# Patient Record
Sex: Female | Born: 1980
Health system: Southern US, Community
[De-identification: ages and names within clinical notes are randomized; demographics above are authoritative.]

## PROBLEM LIST (undated history)

## (undated) DIAGNOSIS — Z789 Other specified health status: Secondary | ICD-10-CM

## (undated) HISTORY — DX: Other specified health status: Z78.9

## (undated) HISTORY — PX: NO PAST SURGERIES: SHX2092

---

## 2001-03-29 HISTORY — PX: WISDOM TOOTH EXTRACTION: SHX21

## 2016-03-10 DIAGNOSIS — Z0000 Encounter for general adult medical examination without abnormal findings: Secondary | ICD-10-CM | POA: Diagnosis not present

## 2016-03-10 DIAGNOSIS — Z23 Encounter for immunization: Secondary | ICD-10-CM | POA: Diagnosis not present

## 2016-03-10 DIAGNOSIS — R8290 Unspecified abnormal findings in urine: Secondary | ICD-10-CM | POA: Diagnosis not present

## 2016-03-10 DIAGNOSIS — D649 Anemia, unspecified: Secondary | ICD-10-CM | POA: Diagnosis not present

## 2016-04-05 DIAGNOSIS — R319 Hematuria, unspecified: Secondary | ICD-10-CM | POA: Diagnosis not present

## 2017-03-15 DIAGNOSIS — Z13220 Encounter for screening for lipoid disorders: Secondary | ICD-10-CM | POA: Diagnosis not present

## 2017-03-15 DIAGNOSIS — Z0000 Encounter for general adult medical examination without abnormal findings: Secondary | ICD-10-CM | POA: Diagnosis not present

## 2017-03-15 DIAGNOSIS — Z131 Encounter for screening for diabetes mellitus: Secondary | ICD-10-CM | POA: Diagnosis not present

## 2017-04-01 DIAGNOSIS — R319 Hematuria, unspecified: Secondary | ICD-10-CM | POA: Diagnosis not present

## 2018-03-31 DIAGNOSIS — J029 Acute pharyngitis, unspecified: Secondary | ICD-10-CM | POA: Diagnosis not present

## 2018-04-10 ENCOUNTER — Other Ambulatory Visit: Payer: Self-pay | Admitting: Family Medicine

## 2018-04-10 ENCOUNTER — Other Ambulatory Visit (HOSPITAL_COMMUNITY)
Admission: RE | Admit: 2018-04-10 | Discharge: 2018-04-10 | Disposition: A | Discharge status: Home / Self Care | Payer: BLUE CROSS/BLUE SHIELD | Source: Ambulatory Visit | Attending: Family Medicine | Admitting: Family Medicine

## 2018-04-10 DIAGNOSIS — Z124 Encounter for screening for malignant neoplasm of cervix: Secondary | ICD-10-CM | POA: Insufficient documentation

## 2018-04-10 DIAGNOSIS — Z0000 Encounter for general adult medical examination without abnormal findings: Secondary | ICD-10-CM | POA: Diagnosis not present

## 2018-04-10 DIAGNOSIS — R634 Abnormal weight loss: Secondary | ICD-10-CM | POA: Diagnosis not present

## 2018-04-10 DIAGNOSIS — R319 Hematuria, unspecified: Secondary | ICD-10-CM | POA: Diagnosis not present

## 2018-04-12 LAB — CYTOLOGY - PAP
Diagnosis: NEGATIVE
HPV: NOT DETECTED

## 2018-05-09 DIAGNOSIS — J069 Acute upper respiratory infection, unspecified: Secondary | ICD-10-CM | POA: Diagnosis not present

## 2018-10-26 DIAGNOSIS — Z3200 Encounter for pregnancy test, result unknown: Secondary | ICD-10-CM | POA: Diagnosis not present

## 2018-10-26 DIAGNOSIS — Z3689 Encounter for other specified antenatal screening: Secondary | ICD-10-CM | POA: Diagnosis not present

## 2018-11-07 DIAGNOSIS — Z3682 Encounter for antenatal screening for nuchal translucency: Secondary | ICD-10-CM | POA: Diagnosis not present

## 2018-11-07 DIAGNOSIS — O09511 Supervision of elderly primigravida, first trimester: Secondary | ICD-10-CM | POA: Diagnosis not present

## 2018-11-07 DIAGNOSIS — Z3689 Encounter for other specified antenatal screening: Secondary | ICD-10-CM | POA: Diagnosis not present

## 2018-11-07 DIAGNOSIS — Z3201 Encounter for pregnancy test, result positive: Secondary | ICD-10-CM | POA: Diagnosis not present

## 2018-11-07 LAB — OB RESULTS CONSOLE GC/CHLAMYDIA
Chlamydia: NEGATIVE
Gonorrhea: NEGATIVE

## 2018-11-07 LAB — OB RESULTS CONSOLE RUBELLA ANTIBODY, IGM: Rubella: IMMUNE

## 2018-11-07 LAB — OB RESULTS CONSOLE HEPATITIS B SURFACE ANTIGEN: Hepatitis B Surface Ag: NEGATIVE

## 2018-11-07 LAB — OB RESULTS CONSOLE ANTIBODY SCREEN: Antibody Screen: NEGATIVE

## 2018-11-07 LAB — OB RESULTS CONSOLE ABO/RH: RH Type: POSITIVE

## 2018-11-07 LAB — OB RESULTS CONSOLE RPR: RPR: NONREACTIVE

## 2018-11-07 LAB — OB RESULTS CONSOLE HIV ANTIBODY (ROUTINE TESTING): HIV: NONREACTIVE

## 2018-11-21 DIAGNOSIS — Z3689 Encounter for other specified antenatal screening: Secondary | ICD-10-CM | POA: Diagnosis not present

## 2018-11-21 DIAGNOSIS — O09511 Supervision of elderly primigravida, first trimester: Secondary | ICD-10-CM | POA: Diagnosis not present

## 2018-11-21 DIAGNOSIS — O09512 Supervision of elderly primigravida, second trimester: Secondary | ICD-10-CM | POA: Diagnosis not present

## 2018-11-21 DIAGNOSIS — Z3A14 14 weeks gestation of pregnancy: Secondary | ICD-10-CM | POA: Diagnosis not present

## 2018-12-26 DIAGNOSIS — O09519 Supervision of elderly primigravida, unspecified trimester: Secondary | ICD-10-CM | POA: Diagnosis not present

## 2018-12-26 DIAGNOSIS — O09522 Supervision of elderly multigravida, second trimester: Secondary | ICD-10-CM | POA: Diagnosis not present

## 2018-12-26 DIAGNOSIS — Z3A19 19 weeks gestation of pregnancy: Secondary | ICD-10-CM | POA: Diagnosis not present

## 2018-12-26 DIAGNOSIS — Z361 Encounter for antenatal screening for raised alphafetoprotein level: Secondary | ICD-10-CM | POA: Diagnosis not present

## 2019-01-22 DIAGNOSIS — Z3A23 23 weeks gestation of pregnancy: Secondary | ICD-10-CM | POA: Diagnosis not present

## 2019-01-22 DIAGNOSIS — O09522 Supervision of elderly multigravida, second trimester: Secondary | ICD-10-CM | POA: Diagnosis not present

## 2019-01-23 ENCOUNTER — Other Ambulatory Visit (HOSPITAL_COMMUNITY): Payer: Self-pay | Admitting: Obstetrics and Gynecology

## 2019-01-23 ENCOUNTER — Encounter (HOSPITAL_COMMUNITY): Payer: Self-pay

## 2019-01-23 DIAGNOSIS — O43122 Velamentous insertion of umbilical cord, second trimester: Secondary | ICD-10-CM

## 2019-01-23 DIAGNOSIS — O26842 Uterine size-date discrepancy, second trimester: Secondary | ICD-10-CM

## 2019-01-23 DIAGNOSIS — Z363 Encounter for antenatal screening for malformations: Secondary | ICD-10-CM

## 2019-01-23 DIAGNOSIS — Z3A24 24 weeks gestation of pregnancy: Secondary | ICD-10-CM

## 2019-01-24 ENCOUNTER — Other Ambulatory Visit: Payer: Self-pay

## 2019-01-24 ENCOUNTER — Encounter (HOSPITAL_COMMUNITY): Payer: Self-pay

## 2019-01-24 ENCOUNTER — Ambulatory Visit (HOSPITAL_COMMUNITY): Payer: BC Managed Care – PPO | Admitting: *Deleted

## 2019-01-24 ENCOUNTER — Other Ambulatory Visit (HOSPITAL_COMMUNITY): Payer: Self-pay | Admitting: Obstetrics and Gynecology

## 2019-01-24 ENCOUNTER — Other Ambulatory Visit (HOSPITAL_COMMUNITY): Payer: Self-pay | Admitting: *Deleted

## 2019-01-24 ENCOUNTER — Ambulatory Visit (HOSPITAL_COMMUNITY)
Admission: RE | Admit: 2019-01-24 | Discharge: 2019-01-24 | Disposition: A | Discharge status: Home / Self Care | Payer: BC Managed Care – PPO | Source: Ambulatory Visit | Attending: Obstetrics and Gynecology | Admitting: Obstetrics and Gynecology

## 2019-01-24 DIAGNOSIS — Z3A24 24 weeks gestation of pregnancy: Secondary | ICD-10-CM | POA: Diagnosis not present

## 2019-01-24 DIAGNOSIS — O09522 Supervision of elderly multigravida, second trimester: Secondary | ICD-10-CM | POA: Diagnosis not present

## 2019-01-24 DIAGNOSIS — O365920 Maternal care for other known or suspected poor fetal growth, second trimester, not applicable or unspecified: Secondary | ICD-10-CM

## 2019-01-24 DIAGNOSIS — O43122 Velamentous insertion of umbilical cord, second trimester: Secondary | ICD-10-CM | POA: Diagnosis not present

## 2019-01-24 DIAGNOSIS — Z363 Encounter for antenatal screening for malformations: Secondary | ICD-10-CM

## 2019-01-24 DIAGNOSIS — O26842 Uterine size-date discrepancy, second trimester: Secondary | ICD-10-CM

## 2019-01-24 DIAGNOSIS — Z3A23 23 weeks gestation of pregnancy: Secondary | ICD-10-CM | POA: Diagnosis not present

## 2019-01-30 ENCOUNTER — Ambulatory Visit (HOSPITAL_COMMUNITY): Payer: BLUE CROSS/BLUE SHIELD

## 2019-01-31 ENCOUNTER — Ambulatory Visit (HOSPITAL_COMMUNITY): Payer: BLUE CROSS/BLUE SHIELD

## 2019-01-31 ENCOUNTER — Other Ambulatory Visit (HOSPITAL_COMMUNITY): Payer: BLUE CROSS/BLUE SHIELD

## 2019-02-01 ENCOUNTER — Encounter (HOSPITAL_COMMUNITY): Payer: Self-pay | Admitting: Obstetrics

## 2019-02-07 ENCOUNTER — Ambulatory Visit (HOSPITAL_COMMUNITY)
Admission: RE | Admit: 2019-02-07 | Discharge: 2019-02-07 | Disposition: A | Discharge status: Home / Self Care | Payer: BLUE CROSS/BLUE SHIELD | Source: Ambulatory Visit | Attending: Obstetrics and Gynecology | Admitting: Obstetrics and Gynecology

## 2019-02-07 ENCOUNTER — Encounter (HOSPITAL_COMMUNITY): Payer: Self-pay

## 2019-02-07 ENCOUNTER — Other Ambulatory Visit (HOSPITAL_COMMUNITY): Payer: Self-pay | Admitting: *Deleted

## 2019-02-07 ENCOUNTER — Other Ambulatory Visit: Payer: Self-pay

## 2019-02-07 ENCOUNTER — Ambulatory Visit (HOSPITAL_COMMUNITY): Payer: BLUE CROSS/BLUE SHIELD | Admitting: *Deleted

## 2019-02-07 VITALS — BP 109/67 | HR 83 | Temp 98.5°F

## 2019-02-07 DIAGNOSIS — Z3A25 25 weeks gestation of pregnancy: Secondary | ICD-10-CM

## 2019-02-07 DIAGNOSIS — O09519 Supervision of elderly primigravida, unspecified trimester: Secondary | ICD-10-CM

## 2019-02-07 DIAGNOSIS — O09522 Supervision of elderly multigravida, second trimester: Secondary | ICD-10-CM

## 2019-02-07 DIAGNOSIS — O365920 Maternal care for other known or suspected poor fetal growth, second trimester, not applicable or unspecified: Secondary | ICD-10-CM | POA: Insufficient documentation

## 2019-02-07 DIAGNOSIS — O43122 Velamentous insertion of umbilical cord, second trimester: Secondary | ICD-10-CM

## 2019-02-07 DIAGNOSIS — O365990 Maternal care for other known or suspected poor fetal growth, unspecified trimester, not applicable or unspecified: Secondary | ICD-10-CM

## 2019-02-13 DIAGNOSIS — O09522 Supervision of elderly multigravida, second trimester: Secondary | ICD-10-CM | POA: Diagnosis not present

## 2019-02-13 DIAGNOSIS — Z3A26 26 weeks gestation of pregnancy: Secondary | ICD-10-CM | POA: Diagnosis not present

## 2019-02-14 ENCOUNTER — Ambulatory Visit (HOSPITAL_COMMUNITY): Payer: BLUE CROSS/BLUE SHIELD | Admitting: *Deleted

## 2019-02-14 ENCOUNTER — Encounter (HOSPITAL_COMMUNITY): Payer: Self-pay

## 2019-02-14 ENCOUNTER — Other Ambulatory Visit: Payer: Self-pay

## 2019-02-14 ENCOUNTER — Ambulatory Visit (HOSPITAL_COMMUNITY)
Admission: RE | Admit: 2019-02-14 | Discharge: 2019-02-14 | Disposition: A | Discharge status: Home / Self Care | Payer: BLUE CROSS/BLUE SHIELD | Source: Ambulatory Visit | Attending: Obstetrics and Gynecology | Admitting: Obstetrics and Gynecology

## 2019-02-14 VITALS — BP 98/60 | HR 60 | Temp 97.2°F

## 2019-02-14 DIAGNOSIS — O09519 Supervision of elderly primigravida, unspecified trimester: Secondary | ICD-10-CM

## 2019-02-14 DIAGNOSIS — O365990 Maternal care for other known or suspected poor fetal growth, unspecified trimester, not applicable or unspecified: Secondary | ICD-10-CM | POA: Diagnosis not present

## 2019-02-14 DIAGNOSIS — O43122 Velamentous insertion of umbilical cord, second trimester: Secondary | ICD-10-CM

## 2019-02-14 DIAGNOSIS — Z3A26 26 weeks gestation of pregnancy: Secondary | ICD-10-CM

## 2019-02-14 DIAGNOSIS — O09522 Supervision of elderly multigravida, second trimester: Secondary | ICD-10-CM

## 2019-02-14 DIAGNOSIS — Z362 Encounter for other antenatal screening follow-up: Secondary | ICD-10-CM | POA: Diagnosis not present

## 2019-02-14 DIAGNOSIS — O365920 Maternal care for other known or suspected poor fetal growth, second trimester, not applicable or unspecified: Secondary | ICD-10-CM | POA: Diagnosis not present

## 2019-02-15 ENCOUNTER — Other Ambulatory Visit (HOSPITAL_COMMUNITY): Payer: Self-pay | Admitting: *Deleted

## 2019-02-15 ENCOUNTER — Other Ambulatory Visit (HOSPITAL_COMMUNITY): Payer: BC Managed Care – PPO

## 2019-02-15 DIAGNOSIS — O365930 Maternal care for other known or suspected poor fetal growth, third trimester, not applicable or unspecified: Secondary | ICD-10-CM

## 2019-02-26 ENCOUNTER — Ambulatory Visit (HOSPITAL_COMMUNITY): Payer: BLUE CROSS/BLUE SHIELD | Admitting: *Deleted

## 2019-02-26 ENCOUNTER — Ambulatory Visit (HOSPITAL_COMMUNITY)
Admission: RE | Admit: 2019-02-26 | Discharge: 2019-02-26 | Disposition: A | Discharge status: Home / Self Care | Payer: BLUE CROSS/BLUE SHIELD | Source: Ambulatory Visit | Attending: Obstetrics and Gynecology | Admitting: Obstetrics and Gynecology

## 2019-02-26 ENCOUNTER — Encounter (HOSPITAL_COMMUNITY): Payer: Self-pay

## 2019-02-26 ENCOUNTER — Other Ambulatory Visit: Payer: Self-pay

## 2019-02-26 VITALS — BP 108/53 | HR 74 | Temp 97.4°F

## 2019-02-26 DIAGNOSIS — Z3A28 28 weeks gestation of pregnancy: Secondary | ICD-10-CM

## 2019-02-26 DIAGNOSIS — O365930 Maternal care for other known or suspected poor fetal growth, third trimester, not applicable or unspecified: Secondary | ICD-10-CM

## 2019-02-26 DIAGNOSIS — O43123 Velamentous insertion of umbilical cord, third trimester: Secondary | ICD-10-CM

## 2019-02-26 DIAGNOSIS — O365920 Maternal care for other known or suspected poor fetal growth, second trimester, not applicable or unspecified: Secondary | ICD-10-CM

## 2019-02-26 NOTE — Procedures (Signed)
RAPHAELA CANNADAY February 25, 1981 [redacted]w[redacted]d  Fetus A Non-Stress Test Interpretation for 02/26/19  Indication: IUGR  Fetal Heart Rate A Mode: External Baseline Rate (A): 140 bpm Variability: Moderate Accelerations: 15 x 15 Decelerations: Variable  Uterine Activity Mode: Toco Contraction Frequency (min): none noted  Interpretation (Fetal Testing) Nonstress Test Interpretation: Reactive Comments: FHR tracing rev'd by Dr. Donalee Citrin

## 2019-02-27 ENCOUNTER — Other Ambulatory Visit (HOSPITAL_COMMUNITY): Payer: Self-pay | Admitting: *Deleted

## 2019-02-27 DIAGNOSIS — O365930 Maternal care for other known or suspected poor fetal growth, third trimester, not applicable or unspecified: Secondary | ICD-10-CM

## 2019-03-01 DIAGNOSIS — O365930 Maternal care for other known or suspected poor fetal growth, third trimester, not applicable or unspecified: Secondary | ICD-10-CM | POA: Diagnosis not present

## 2019-03-01 DIAGNOSIS — Z3689 Encounter for other specified antenatal screening: Secondary | ICD-10-CM | POA: Diagnosis not present

## 2019-03-01 DIAGNOSIS — Z3A28 28 weeks gestation of pregnancy: Secondary | ICD-10-CM | POA: Diagnosis not present

## 2019-03-01 DIAGNOSIS — Z23 Encounter for immunization: Secondary | ICD-10-CM | POA: Diagnosis not present

## 2019-03-06 ENCOUNTER — Ambulatory Visit (HOSPITAL_COMMUNITY)
Admission: RE | Admit: 2019-03-06 | Discharge: 2019-03-06 | Disposition: A | Discharge status: Home / Self Care | Payer: BLUE CROSS/BLUE SHIELD | Source: Ambulatory Visit | Attending: Obstetrics and Gynecology | Admitting: Obstetrics and Gynecology

## 2019-03-06 ENCOUNTER — Encounter (HOSPITAL_COMMUNITY): Payer: Self-pay | Admitting: *Deleted

## 2019-03-06 ENCOUNTER — Other Ambulatory Visit: Payer: Self-pay

## 2019-03-06 ENCOUNTER — Ambulatory Visit (HOSPITAL_COMMUNITY): Payer: BLUE CROSS/BLUE SHIELD | Admitting: *Deleted

## 2019-03-06 VITALS — BP 108/59 | HR 68 | Temp 97.4°F

## 2019-03-06 DIAGNOSIS — O43123 Velamentous insertion of umbilical cord, third trimester: Secondary | ICD-10-CM | POA: Diagnosis not present

## 2019-03-06 DIAGNOSIS — O09523 Supervision of elderly multigravida, third trimester: Secondary | ICD-10-CM | POA: Diagnosis not present

## 2019-03-06 DIAGNOSIS — O365930 Maternal care for other known or suspected poor fetal growth, third trimester, not applicable or unspecified: Secondary | ICD-10-CM | POA: Insufficient documentation

## 2019-03-06 DIAGNOSIS — O09529 Supervision of elderly multigravida, unspecified trimester: Secondary | ICD-10-CM

## 2019-03-06 DIAGNOSIS — Z3A29 29 weeks gestation of pregnancy: Secondary | ICD-10-CM

## 2019-03-08 DIAGNOSIS — O99810 Abnormal glucose complicating pregnancy: Secondary | ICD-10-CM | POA: Diagnosis not present

## 2019-03-08 DIAGNOSIS — Z3A29 29 weeks gestation of pregnancy: Secondary | ICD-10-CM | POA: Diagnosis not present

## 2019-03-13 ENCOUNTER — Ambulatory Visit (HOSPITAL_COMMUNITY)
Admission: RE | Admit: 2019-03-13 | Discharge: 2019-03-13 | Disposition: A | Discharge status: Home / Self Care | Payer: BLUE CROSS/BLUE SHIELD | Source: Ambulatory Visit | Attending: Obstetrics and Gynecology | Admitting: Obstetrics and Gynecology

## 2019-03-13 ENCOUNTER — Encounter (HOSPITAL_COMMUNITY): Payer: Self-pay | Admitting: *Deleted

## 2019-03-13 ENCOUNTER — Other Ambulatory Visit: Payer: Self-pay

## 2019-03-13 ENCOUNTER — Ambulatory Visit (HOSPITAL_COMMUNITY): Payer: BLUE CROSS/BLUE SHIELD | Admitting: *Deleted

## 2019-03-13 VITALS — BP 105/65 | HR 77 | Temp 96.8°F

## 2019-03-13 DIAGNOSIS — O09523 Supervision of elderly multigravida, third trimester: Secondary | ICD-10-CM | POA: Diagnosis not present

## 2019-03-13 DIAGNOSIS — O43123 Velamentous insertion of umbilical cord, third trimester: Secondary | ICD-10-CM | POA: Diagnosis not present

## 2019-03-13 DIAGNOSIS — O365930 Maternal care for other known or suspected poor fetal growth, third trimester, not applicable or unspecified: Secondary | ICD-10-CM | POA: Insufficient documentation

## 2019-03-13 DIAGNOSIS — Z3A30 30 weeks gestation of pregnancy: Secondary | ICD-10-CM | POA: Diagnosis not present

## 2019-03-13 DIAGNOSIS — Z362 Encounter for other antenatal screening follow-up: Secondary | ICD-10-CM | POA: Diagnosis not present

## 2019-03-14 ENCOUNTER — Other Ambulatory Visit (HOSPITAL_COMMUNITY): Payer: Self-pay | Admitting: *Deleted

## 2019-03-14 ENCOUNTER — Other Ambulatory Visit (HOSPITAL_COMMUNITY): Payer: Self-pay | Admitting: Maternal & Fetal Medicine

## 2019-03-14 DIAGNOSIS — O365930 Maternal care for other known or suspected poor fetal growth, third trimester, not applicable or unspecified: Secondary | ICD-10-CM | POA: Diagnosis not present

## 2019-03-14 DIAGNOSIS — O365920 Maternal care for other known or suspected poor fetal growth, second trimester, not applicable or unspecified: Secondary | ICD-10-CM

## 2019-03-14 DIAGNOSIS — Z3A30 30 weeks gestation of pregnancy: Secondary | ICD-10-CM | POA: Diagnosis not present

## 2019-03-20 ENCOUNTER — Other Ambulatory Visit: Payer: Self-pay

## 2019-03-20 ENCOUNTER — Ambulatory Visit (HOSPITAL_COMMUNITY): Payer: BLUE CROSS/BLUE SHIELD | Admitting: *Deleted

## 2019-03-20 ENCOUNTER — Encounter (HOSPITAL_COMMUNITY): Payer: Self-pay | Admitting: *Deleted

## 2019-03-20 ENCOUNTER — Ambulatory Visit (HOSPITAL_COMMUNITY)
Admission: RE | Admit: 2019-03-20 | Discharge: 2019-03-20 | Disposition: A | Discharge status: Home / Self Care | Payer: BLUE CROSS/BLUE SHIELD | Source: Ambulatory Visit | Attending: Maternal & Fetal Medicine | Admitting: Maternal & Fetal Medicine

## 2019-03-20 VITALS — BP 95/53 | HR 68 | Temp 97.2°F

## 2019-03-20 DIAGNOSIS — O43123 Velamentous insertion of umbilical cord, third trimester: Secondary | ICD-10-CM

## 2019-03-20 DIAGNOSIS — Z3A31 31 weeks gestation of pregnancy: Secondary | ICD-10-CM

## 2019-03-20 DIAGNOSIS — O365930 Maternal care for other known or suspected poor fetal growth, third trimester, not applicable or unspecified: Secondary | ICD-10-CM

## 2019-03-20 DIAGNOSIS — O365920 Maternal care for other known or suspected poor fetal growth, second trimester, not applicable or unspecified: Secondary | ICD-10-CM | POA: Insufficient documentation

## 2019-03-20 DIAGNOSIS — O09523 Supervision of elderly multigravida, third trimester: Secondary | ICD-10-CM

## 2019-03-20 NOTE — Procedures (Signed)
Brittany Friedman 04-27-1980 [redacted]w[redacted]d  Fetus A Non-Stress Test Interpretation for 03/20/19  Indication: IUGR  Fetal Heart Rate A Mode: External Baseline Rate (A): 145 bpm Variability: Moderate Accelerations: 15 x 15 Decelerations: None Multiple birth?: No  Uterine Activity Mode: Toco Contraction Frequency (min): one UC noted Contraction Duration (sec): 180 Contraction Quality: Mild Resting Tone Palpated: Relaxed Resting Time: Adequate  Interpretation (Fetal Testing) Nonstress Test Interpretation: Reactive Comments: FHR tracing rev'd by Dr. Gertie Exon

## 2019-03-21 ENCOUNTER — Other Ambulatory Visit (HOSPITAL_COMMUNITY): Payer: Self-pay | Admitting: *Deleted

## 2019-03-21 DIAGNOSIS — O365930 Maternal care for other known or suspected poor fetal growth, third trimester, not applicable or unspecified: Secondary | ICD-10-CM

## 2019-03-27 ENCOUNTER — Encounter (HOSPITAL_COMMUNITY): Payer: Self-pay | Admitting: *Deleted

## 2019-03-27 ENCOUNTER — Ambulatory Visit (HOSPITAL_COMMUNITY): Payer: BLUE CROSS/BLUE SHIELD | Attending: Obstetrics and Gynecology | Admitting: *Deleted

## 2019-03-27 ENCOUNTER — Other Ambulatory Visit: Payer: Self-pay

## 2019-03-27 ENCOUNTER — Ambulatory Visit (HOSPITAL_COMMUNITY): Payer: BLUE CROSS/BLUE SHIELD | Admitting: *Deleted

## 2019-03-27 VITALS — BP 102/54 | HR 67 | Temp 97.4°F

## 2019-03-27 DIAGNOSIS — O365930 Maternal care for other known or suspected poor fetal growth, third trimester, not applicable or unspecified: Secondary | ICD-10-CM | POA: Insufficient documentation

## 2019-03-27 DIAGNOSIS — Z3A32 32 weeks gestation of pregnancy: Secondary | ICD-10-CM | POA: Diagnosis not present

## 2019-03-27 NOTE — Procedures (Signed)
Brittany Friedman 06-11-1980 [redacted]w[redacted]d  Fetus A Non-Stress Test Interpretation for 03/27/19  Indication: IUGR  Fetal Heart Rate A Mode: External Baseline Rate (A): 150 bpm Variability: Moderate Accelerations: 15 x 15 Decelerations: None Multiple birth?: No  Uterine Activity Mode: Toco Contraction Frequency (min): mild UI noted Contraction Duration (sec): 10-30 Contraction Quality: Mild Resting Tone Palpated: Relaxed Resting Time: Adequate  Interpretation (Fetal Testing) Nonstress Test Interpretation: Reactive Comments: FHR tracing rev'd by Dr. Donalee Citrin

## 2019-03-30 NOTE — L&D Delivery Note (Signed)
Delivery Note:   G1P0 at [redacted]w[redacted]d  Admitting diagnosis: Pregnant [Z34.90] Risks: IUGR, velamentous cord Onset of labor: IOL, cervical ripening started overnight, onset physiologic labor at 12 pm Augmentation: Cytotec and Foley Balloon ROM: spont, clear AF at 1000  Complete dilation at 05/19/2019  1516 Onset of pushing at 1520 FHR second stage cat 1  Analgesia /Anesthesia intrapartum:None  Pushing in R and L lateral position with CNM and L&D staff support, Trey Paula present for birth and supportive.  Delivery of a Live born female  Birth Weight:  pending APGAR: 8, 9  Newborn Delivery   Birth date/time: 05/19/2019 15:59:00 Delivery type: Vaginal, Spontaneous      in cephalic presentation, position OA to LOT.   Nuchal Cord: double nuchal, baby somersaulted on perineum Cord double clamped after cessation of pulsation, cut by FOB.  Collection of cord blood for typing completed. Cord blood donation-Velamentous  Arterial cord blood sample-No    Placenta delivered-Spontaneous;Pathology  with 3 vessels . Irregular placenta plate with and some lobular areas Uterotonics: none Placenta to pathology. Uterine tone firm, bleeding small  2nd degree  laceration identified.  Episiotomy:None  Local analgesia: lido 1%  Repair: 3.0 and 4.0 vicryl, repair in standard fashion, good hemostasis Est. Blood Loss (mL):200.00   Complications: None  APGAR:1 min-8 , 5 min-9  10 min-   Mom to postpartum.  Baby [facility] to [facility] care / Skin to Skin.  Delivery Report:  Review the Delivery Report for details.     Signed: Neta Mends, CNM, MSN 05/19/2019, 5:03 PM

## 2019-04-03 ENCOUNTER — Ambulatory Visit (HOSPITAL_COMMUNITY): Payer: BLUE CROSS/BLUE SHIELD | Admitting: *Deleted

## 2019-04-03 ENCOUNTER — Other Ambulatory Visit (HOSPITAL_COMMUNITY): Payer: Self-pay | Admitting: Maternal & Fetal Medicine

## 2019-04-03 ENCOUNTER — Encounter (HOSPITAL_COMMUNITY): Payer: Self-pay

## 2019-04-03 ENCOUNTER — Other Ambulatory Visit: Payer: Self-pay

## 2019-04-03 ENCOUNTER — Ambulatory Visit (HOSPITAL_COMMUNITY)
Admission: RE | Admit: 2019-04-03 | Discharge: 2019-04-03 | Disposition: A | Discharge status: Home / Self Care | Payer: BLUE CROSS/BLUE SHIELD | Source: Ambulatory Visit | Attending: Obstetrics and Gynecology | Admitting: Obstetrics and Gynecology

## 2019-04-03 ENCOUNTER — Other Ambulatory Visit (HOSPITAL_COMMUNITY): Payer: Self-pay | Admitting: *Deleted

## 2019-04-03 VITALS — BP 100/56 | HR 74 | Temp 97.1°F

## 2019-04-03 DIAGNOSIS — O365930 Maternal care for other known or suspected poor fetal growth, third trimester, not applicable or unspecified: Secondary | ICD-10-CM

## 2019-04-03 DIAGNOSIS — O43123 Velamentous insertion of umbilical cord, third trimester: Secondary | ICD-10-CM

## 2019-04-03 DIAGNOSIS — O09523 Supervision of elderly multigravida, third trimester: Secondary | ICD-10-CM | POA: Diagnosis not present

## 2019-04-03 DIAGNOSIS — Z362 Encounter for other antenatal screening follow-up: Secondary | ICD-10-CM

## 2019-04-03 DIAGNOSIS — Z3A33 33 weeks gestation of pregnancy: Secondary | ICD-10-CM

## 2019-04-10 ENCOUNTER — Ambulatory Visit (HOSPITAL_BASED_OUTPATIENT_CLINIC_OR_DEPARTMENT_OTHER): Payer: BLUE CROSS/BLUE SHIELD | Admitting: *Deleted

## 2019-04-10 ENCOUNTER — Encounter (HOSPITAL_COMMUNITY): Payer: Self-pay | Admitting: *Deleted

## 2019-04-10 ENCOUNTER — Other Ambulatory Visit: Payer: Self-pay

## 2019-04-10 ENCOUNTER — Ambulatory Visit (HOSPITAL_COMMUNITY): Payer: BLUE CROSS/BLUE SHIELD | Attending: Obstetrics | Admitting: *Deleted

## 2019-04-10 VITALS — BP 98/53 | HR 68 | Temp 98.4°F

## 2019-04-10 DIAGNOSIS — Z3A34 34 weeks gestation of pregnancy: Secondary | ICD-10-CM

## 2019-04-10 DIAGNOSIS — O365931 Maternal care for other known or suspected poor fetal growth, third trimester, fetus 1: Secondary | ICD-10-CM | POA: Insufficient documentation

## 2019-04-10 DIAGNOSIS — O365930 Maternal care for other known or suspected poor fetal growth, third trimester, not applicable or unspecified: Secondary | ICD-10-CM

## 2019-04-10 NOTE — Procedures (Signed)
Brittany Friedman 06-07-1980 [redacted]w[redacted]d  Fetus A Non-Stress Test Interpretation for 04/10/19  Indication: IUGR  Fetal Heart Rate A Mode: External Baseline Rate (A): 150 bpm Variability: Moderate Accelerations: 15 x 15 Decelerations: None Multiple birth?: No  Uterine Activity Mode: Palpation, Toco Contraction Frequency (min): UI Contraction Duration (sec): 10 Contraction Quality: Mild Resting Tone Palpated: Relaxed Resting Time: Adequate  Interpretation (Fetal Testing) Nonstress Test Interpretation: Reactive Comments: EFM tracing reviewed by Dr. Parke Poisson

## 2019-04-13 DIAGNOSIS — O365930 Maternal care for other known or suspected poor fetal growth, third trimester, not applicable or unspecified: Secondary | ICD-10-CM | POA: Diagnosis not present

## 2019-04-13 DIAGNOSIS — Z3A34 34 weeks gestation of pregnancy: Secondary | ICD-10-CM | POA: Diagnosis not present

## 2019-04-13 DIAGNOSIS — O43123 Velamentous insertion of umbilical cord, third trimester: Secondary | ICD-10-CM | POA: Diagnosis not present

## 2019-04-13 DIAGNOSIS — O09523 Supervision of elderly multigravida, third trimester: Secondary | ICD-10-CM | POA: Diagnosis not present

## 2019-04-17 ENCOUNTER — Ambulatory Visit (HOSPITAL_COMMUNITY)
Admission: RE | Admit: 2019-04-17 | Discharge: 2019-04-17 | Disposition: A | Discharge status: Home / Self Care | Payer: BLUE CROSS/BLUE SHIELD | Source: Ambulatory Visit | Attending: Obstetrics | Admitting: Obstetrics

## 2019-04-17 ENCOUNTER — Encounter (HOSPITAL_COMMUNITY): Payer: Self-pay

## 2019-04-17 ENCOUNTER — Other Ambulatory Visit: Payer: Self-pay

## 2019-04-17 ENCOUNTER — Ambulatory Visit (HOSPITAL_COMMUNITY): Payer: BLUE CROSS/BLUE SHIELD | Admitting: *Deleted

## 2019-04-17 VITALS — BP 102/59 | HR 79 | Temp 97.3°F

## 2019-04-17 DIAGNOSIS — O365930 Maternal care for other known or suspected poor fetal growth, third trimester, not applicable or unspecified: Secondary | ICD-10-CM

## 2019-04-17 DIAGNOSIS — Z3A35 35 weeks gestation of pregnancy: Secondary | ICD-10-CM

## 2019-04-17 DIAGNOSIS — O09523 Supervision of elderly multigravida, third trimester: Secondary | ICD-10-CM | POA: Diagnosis not present

## 2019-04-17 DIAGNOSIS — O43123 Velamentous insertion of umbilical cord, third trimester: Secondary | ICD-10-CM

## 2019-04-24 ENCOUNTER — Ambulatory Visit (HOSPITAL_COMMUNITY): Payer: BLUE CROSS/BLUE SHIELD | Admitting: *Deleted

## 2019-04-24 ENCOUNTER — Ambulatory Visit (HOSPITAL_COMMUNITY)
Admission: RE | Admit: 2019-04-24 | Discharge: 2019-04-24 | Disposition: A | Discharge status: Home / Self Care | Payer: BLUE CROSS/BLUE SHIELD | Source: Ambulatory Visit | Attending: Obstetrics | Admitting: Obstetrics

## 2019-04-24 ENCOUNTER — Other Ambulatory Visit: Payer: Self-pay

## 2019-04-24 ENCOUNTER — Other Ambulatory Visit (HOSPITAL_COMMUNITY): Payer: Self-pay | Admitting: *Deleted

## 2019-04-24 ENCOUNTER — Other Ambulatory Visit (HOSPITAL_COMMUNITY): Payer: Self-pay | Admitting: Obstetrics

## 2019-04-24 ENCOUNTER — Encounter (HOSPITAL_COMMUNITY): Payer: Self-pay

## 2019-04-24 VITALS — BP 99/58 | HR 70 | Temp 97.5°F

## 2019-04-24 DIAGNOSIS — O365930 Maternal care for other known or suspected poor fetal growth, third trimester, not applicable or unspecified: Secondary | ICD-10-CM

## 2019-04-24 DIAGNOSIS — Z3A36 36 weeks gestation of pregnancy: Secondary | ICD-10-CM | POA: Diagnosis not present

## 2019-04-24 DIAGNOSIS — O09523 Supervision of elderly multigravida, third trimester: Secondary | ICD-10-CM | POA: Diagnosis not present

## 2019-04-24 DIAGNOSIS — O43123 Velamentous insertion of umbilical cord, third trimester: Secondary | ICD-10-CM | POA: Diagnosis not present

## 2019-04-24 DIAGNOSIS — O0990 Supervision of high risk pregnancy, unspecified, unspecified trimester: Secondary | ICD-10-CM

## 2019-04-24 DIAGNOSIS — Z362 Encounter for other antenatal screening follow-up: Secondary | ICD-10-CM | POA: Diagnosis not present

## 2019-04-25 DIAGNOSIS — Z0000 Encounter for general adult medical examination without abnormal findings: Secondary | ICD-10-CM | POA: Diagnosis not present

## 2019-04-27 DIAGNOSIS — Z3685 Encounter for antenatal screening for Streptococcus B: Secondary | ICD-10-CM | POA: Diagnosis not present

## 2019-04-27 DIAGNOSIS — O365930 Maternal care for other known or suspected poor fetal growth, third trimester, not applicable or unspecified: Secondary | ICD-10-CM | POA: Diagnosis not present

## 2019-04-27 DIAGNOSIS — O43123 Velamentous insertion of umbilical cord, third trimester: Secondary | ICD-10-CM | POA: Diagnosis not present

## 2019-04-27 DIAGNOSIS — Z3A36 36 weeks gestation of pregnancy: Secondary | ICD-10-CM | POA: Diagnosis not present

## 2019-04-27 LAB — OB RESULTS CONSOLE GBS: GBS: NEGATIVE

## 2019-05-01 ENCOUNTER — Other Ambulatory Visit: Payer: Self-pay

## 2019-05-01 ENCOUNTER — Ambulatory Visit (HOSPITAL_COMMUNITY)
Admission: RE | Admit: 2019-05-01 | Discharge: 2019-05-01 | Disposition: A | Discharge status: Home / Self Care | Payer: BLUE CROSS/BLUE SHIELD | Source: Ambulatory Visit | Attending: Obstetrics and Gynecology | Admitting: Obstetrics and Gynecology

## 2019-05-01 ENCOUNTER — Encounter (HOSPITAL_COMMUNITY): Payer: Self-pay

## 2019-05-01 ENCOUNTER — Other Ambulatory Visit (HOSPITAL_COMMUNITY): Payer: Self-pay | Admitting: *Deleted

## 2019-05-01 ENCOUNTER — Ambulatory Visit (HOSPITAL_COMMUNITY): Payer: BLUE CROSS/BLUE SHIELD | Admitting: *Deleted

## 2019-05-01 VITALS — BP 97/57 | HR 62 | Temp 97.2°F

## 2019-05-01 DIAGNOSIS — O365930 Maternal care for other known or suspected poor fetal growth, third trimester, not applicable or unspecified: Secondary | ICD-10-CM | POA: Diagnosis not present

## 2019-05-01 DIAGNOSIS — Z3A37 37 weeks gestation of pregnancy: Secondary | ICD-10-CM

## 2019-05-03 DIAGNOSIS — O365930 Maternal care for other known or suspected poor fetal growth, third trimester, not applicable or unspecified: Secondary | ICD-10-CM | POA: Diagnosis not present

## 2019-05-03 DIAGNOSIS — O43123 Velamentous insertion of umbilical cord, third trimester: Secondary | ICD-10-CM | POA: Diagnosis not present

## 2019-05-03 DIAGNOSIS — Z3A37 37 weeks gestation of pregnancy: Secondary | ICD-10-CM | POA: Diagnosis not present

## 2019-05-07 DIAGNOSIS — Z3A38 38 weeks gestation of pregnancy: Secondary | ICD-10-CM | POA: Diagnosis not present

## 2019-05-07 DIAGNOSIS — O365930 Maternal care for other known or suspected poor fetal growth, third trimester, not applicable or unspecified: Secondary | ICD-10-CM | POA: Diagnosis not present

## 2019-05-08 ENCOUNTER — Ambulatory Visit (HOSPITAL_COMMUNITY): Payer: BLUE CROSS/BLUE SHIELD

## 2019-05-08 ENCOUNTER — Encounter (HOSPITAL_COMMUNITY): Payer: Self-pay

## 2019-05-10 ENCOUNTER — Encounter (HOSPITAL_COMMUNITY): Payer: Self-pay | Admitting: *Deleted

## 2019-05-10 ENCOUNTER — Telehealth (HOSPITAL_COMMUNITY): Payer: Self-pay | Admitting: *Deleted

## 2019-05-10 NOTE — Telephone Encounter (Signed)
Preadmission screen  

## 2019-05-11 DIAGNOSIS — O09523 Supervision of elderly multigravida, third trimester: Secondary | ICD-10-CM | POA: Diagnosis not present

## 2019-05-11 DIAGNOSIS — O365930 Maternal care for other known or suspected poor fetal growth, third trimester, not applicable or unspecified: Secondary | ICD-10-CM | POA: Diagnosis not present

## 2019-05-11 DIAGNOSIS — O43123 Velamentous insertion of umbilical cord, third trimester: Secondary | ICD-10-CM | POA: Diagnosis not present

## 2019-05-11 DIAGNOSIS — Z3A38 38 weeks gestation of pregnancy: Secondary | ICD-10-CM | POA: Diagnosis not present

## 2019-05-14 DIAGNOSIS — Z3A39 39 weeks gestation of pregnancy: Secondary | ICD-10-CM | POA: Diagnosis not present

## 2019-05-14 DIAGNOSIS — O365930 Maternal care for other known or suspected poor fetal growth, third trimester, not applicable or unspecified: Secondary | ICD-10-CM | POA: Diagnosis not present

## 2019-05-14 DIAGNOSIS — O09523 Supervision of elderly multigravida, third trimester: Secondary | ICD-10-CM | POA: Diagnosis not present

## 2019-05-16 ENCOUNTER — Other Ambulatory Visit (HOSPITAL_COMMUNITY)
Admission: RE | Admit: 2019-05-16 | Discharge: 2019-05-16 | Disposition: A | Discharge status: Home / Self Care | Payer: BLUE CROSS/BLUE SHIELD | Source: Ambulatory Visit | Attending: Obstetrics and Gynecology | Admitting: Obstetrics and Gynecology

## 2019-05-16 DIAGNOSIS — Z01812 Encounter for preprocedural laboratory examination: Secondary | ICD-10-CM | POA: Diagnosis not present

## 2019-05-16 DIAGNOSIS — Z20822 Contact with and (suspected) exposure to covid-19: Secondary | ICD-10-CM | POA: Insufficient documentation

## 2019-05-16 LAB — SARS CORONAVIRUS 2 (TAT 6-24 HRS): SARS Coronavirus 2: NEGATIVE

## 2019-05-17 ENCOUNTER — Other Ambulatory Visit (HOSPITAL_COMMUNITY): Payer: BLUE CROSS/BLUE SHIELD

## 2019-05-18 ENCOUNTER — Other Ambulatory Visit: Payer: Self-pay

## 2019-05-19 ENCOUNTER — Encounter (HOSPITAL_COMMUNITY): Payer: Self-pay | Admitting: Obstetrics

## 2019-05-19 ENCOUNTER — Inpatient Hospital Stay (HOSPITAL_COMMUNITY): Payer: BLUE CROSS/BLUE SHIELD

## 2019-05-19 ENCOUNTER — Inpatient Hospital Stay (HOSPITAL_COMMUNITY)
Admission: AD | Admit: 2019-05-19 | Discharge: 2019-05-21 | DRG: 807 | Disposition: A | Discharge status: Home / Self Care | Payer: BLUE CROSS/BLUE SHIELD | Attending: Obstetrics | Admitting: Obstetrics

## 2019-05-19 DIAGNOSIS — O365930 Maternal care for other known or suspected poor fetal growth, third trimester, not applicable or unspecified: Secondary | ICD-10-CM | POA: Diagnosis not present

## 2019-05-19 DIAGNOSIS — Z23 Encounter for immunization: Secondary | ICD-10-CM | POA: Diagnosis not present

## 2019-05-19 DIAGNOSIS — O365990 Maternal care for other known or suspected poor fetal growth, unspecified trimester, not applicable or unspecified: Secondary | ICD-10-CM | POA: Diagnosis not present

## 2019-05-19 DIAGNOSIS — Z3A39 39 weeks gestation of pregnancy: Secondary | ICD-10-CM | POA: Diagnosis not present

## 2019-05-19 DIAGNOSIS — O43123 Velamentous insertion of umbilical cord, third trimester: Secondary | ICD-10-CM | POA: Diagnosis not present

## 2019-05-19 DIAGNOSIS — R94120 Abnormal auditory function study: Secondary | ICD-10-CM | POA: Diagnosis not present

## 2019-05-19 DIAGNOSIS — Z3A40 40 weeks gestation of pregnancy: Secondary | ICD-10-CM

## 2019-05-19 DIAGNOSIS — Z3490 Encounter for supervision of normal pregnancy, unspecified, unspecified trimester: Secondary | ICD-10-CM | POA: Diagnosis present

## 2019-05-19 DIAGNOSIS — Z3A00 Weeks of gestation of pregnancy not specified: Secondary | ICD-10-CM | POA: Diagnosis not present

## 2019-05-19 LAB — ABO/RH: ABO/RH(D): O POS

## 2019-05-19 LAB — CBC
HCT: 36.5 % (ref 36.0–46.0)
Hemoglobin: 12.3 g/dL (ref 12.0–15.0)
MCH: 31.7 pg (ref 26.0–34.0)
MCHC: 33.7 g/dL (ref 30.0–36.0)
MCV: 94.1 fL (ref 80.0–100.0)
Platelets: 174 10*3/uL (ref 150–400)
RBC: 3.88 MIL/uL (ref 3.87–5.11)
RDW: 13.1 % (ref 11.5–15.5)
WBC: 12.7 10*3/uL — ABNORMAL HIGH (ref 4.0–10.5)
nRBC: 0 % (ref 0.0–0.2)

## 2019-05-19 LAB — TYPE AND SCREEN
ABO/RH(D): O POS
Antibody Screen: NEGATIVE

## 2019-05-19 LAB — RPR: RPR Ser Ql: NONREACTIVE

## 2019-05-19 MED ORDER — ONDANSETRON HCL 4 MG/2ML IJ SOLN: 4.0000 mg | Freq: Four times a day (QID) | INTRAMUSCULAR | Status: DC | PRN

## 2019-05-19 MED ORDER — ONDANSETRON HCL 4 MG PO TABS: 4.0000 mg | ORAL_TABLET | ORAL | Status: DC | PRN

## 2019-05-19 MED ORDER — TERBUTALINE SULFATE 1 MG/ML IJ SOLN: 0.2500 mg | Freq: Once | INTRAMUSCULAR | Status: DC | PRN

## 2019-05-19 MED ORDER — SODIUM CHLORIDE 0.9 % IV SOLN: 250.0000 mL | INTRAVENOUS | Status: DC | PRN

## 2019-05-19 MED ORDER — ACETAMINOPHEN 325 MG PO TABS
650.0000 mg | ORAL_TABLET | ORAL | Status: DC | PRN
  Filled 2019-05-19: qty 2

## 2019-05-19 MED ORDER — FLEET ENEMA 7-19 GM/118ML RE ENEM: 1.0000 | ENEMA | Freq: Every day | RECTAL | Status: DC | PRN

## 2019-05-19 MED ORDER — OXYTOCIN BOLUS FROM INFUSION: 500.0000 mL | Freq: Once | INTRAVENOUS | Status: DC

## 2019-05-19 MED ORDER — SOD CITRATE-CITRIC ACID 500-334 MG/5ML PO SOLN: 30.0000 mL | ORAL | Status: DC | PRN

## 2019-05-19 MED ORDER — ZOLPIDEM TARTRATE 5 MG PO TABS: 5.0000 mg | ORAL_TABLET | Freq: Every evening | ORAL | Status: DC | PRN

## 2019-05-19 MED ORDER — IBUPROFEN 600 MG PO TABS
600.0000 mg | ORAL_TABLET | Freq: Four times a day (QID) | ORAL | Status: DC
  Administered 2019-05-20 – 2019-05-21 (×7): 600 mg via ORAL
  Filled 2019-05-19 (×8): qty 1

## 2019-05-19 MED ORDER — OXYTOCIN 40 UNITS IN NORMAL SALINE INFUSION - SIMPLE MED: 2.5000 [IU]/h | INTRAVENOUS | Status: DC

## 2019-05-19 MED ORDER — SENNOSIDES-DOCUSATE SODIUM 8.6-50 MG PO TABS
2.0000 | ORAL_TABLET | ORAL | Status: DC
  Administered 2019-05-20 – 2019-05-21 (×2): 2 via ORAL
  Filled 2019-05-19 (×2): qty 2

## 2019-05-19 MED ORDER — OXYCODONE-ACETAMINOPHEN 5-325 MG PO TABS: 1.0000 | ORAL_TABLET | ORAL | Status: DC | PRN

## 2019-05-19 MED ORDER — ONDANSETRON HCL 4 MG/2ML IJ SOLN: 4.0000 mg | INTRAMUSCULAR | Status: DC | PRN

## 2019-05-19 MED ORDER — OXYTOCIN 10 UNIT/ML IJ SOLN
INTRAMUSCULAR | Status: AC
  Filled 2019-05-19: qty 1

## 2019-05-19 MED ORDER — SODIUM CHLORIDE 0.9% FLUSH: 3.0000 mL | Freq: Two times a day (BID) | INTRAVENOUS | Status: DC

## 2019-05-19 MED ORDER — MISOPROSTOL 50MCG HALF TABLET: 50.0000 ug | ORAL_TABLET | ORAL | Status: DC | PRN

## 2019-05-19 MED ORDER — MISOPROSTOL 50MCG HALF TABLET
50.0000 ug | ORAL_TABLET | ORAL | Status: DC
  Administered 2019-05-19 (×2): 50 ug via BUCCAL
  Filled 2019-05-19: qty 1

## 2019-05-19 MED ORDER — COCONUT OIL OIL: 1.0000 "application " | TOPICAL_OIL | Status: DC | PRN

## 2019-05-19 MED ORDER — WITCH HAZEL-GLYCERIN EX PADS: 1.0000 "application " | MEDICATED_PAD | CUTANEOUS | Status: DC | PRN

## 2019-05-19 MED ORDER — OXYCODONE-ACETAMINOPHEN 5-325 MG PO TABS: 2.0000 | ORAL_TABLET | ORAL | Status: DC | PRN

## 2019-05-19 MED ORDER — SIMETHICONE 80 MG PO CHEW: 80.0000 mg | CHEWABLE_TABLET | ORAL | Status: DC | PRN

## 2019-05-19 MED ORDER — BENZOCAINE-MENTHOL 20-0.5 % EX AERO
1.0000 "application " | INHALATION_SPRAY | CUTANEOUS | Status: DC | PRN
  Administered 2019-05-19: 1 via TOPICAL
  Filled 2019-05-19: qty 56

## 2019-05-19 MED ORDER — TETANUS-DIPHTH-ACELL PERTUSSIS 5-2.5-18.5 LF-MCG/0.5 IM SUSP: 0.5000 mL | Freq: Once | INTRAMUSCULAR | Status: DC

## 2019-05-19 MED ORDER — ACETAMINOPHEN 325 MG PO TABS: 650.0000 mg | ORAL_TABLET | ORAL | Status: DC | PRN

## 2019-05-19 MED ORDER — LIDOCAINE HCL (PF) 1 % IJ SOLN
30.0000 mL | INTRAMUSCULAR | Status: DC | PRN
  Administered 2019-05-19: 30 mL via SUBCUTANEOUS
  Filled 2019-05-19: qty 30

## 2019-05-19 MED ORDER — LIDOCAINE HCL (PF) 1 % IJ SOLN: 30.0000 mL | INTRAMUSCULAR | Status: DC | PRN

## 2019-05-19 MED ORDER — SODIUM CHLORIDE 0.9% FLUSH: 3.0000 mL | INTRAVENOUS | Status: DC | PRN

## 2019-05-19 MED ORDER — DIBUCAINE (PERIANAL) 1 % EX OINT: 1.0000 "application " | TOPICAL_OINTMENT | CUTANEOUS | Status: DC | PRN

## 2019-05-19 MED ORDER — MISOPROSTOL 50MCG HALF TABLET
ORAL_TABLET | ORAL | Status: AC
  Filled 2019-05-19: qty 1

## 2019-05-19 MED ORDER — PRENATAL MULTIVITAMIN CH
1.0000 | ORAL_TABLET | Freq: Every day | ORAL | Status: DC
  Administered 2019-05-20 – 2019-05-21 (×2): 1 via ORAL
  Filled 2019-05-19 (×2): qty 1

## 2019-05-19 MED ORDER — BISACODYL 10 MG RE SUPP: 10.0000 mg | Freq: Every day | RECTAL | Status: DC | PRN

## 2019-05-19 MED ORDER — LACTATED RINGERS IV SOLN: 500.0000 mL | INTRAVENOUS | Status: DC | PRN

## 2019-05-19 MED ORDER — DIPHENHYDRAMINE HCL 25 MG PO CAPS: 25.0000 mg | ORAL_CAPSULE | Freq: Four times a day (QID) | ORAL | Status: DC | PRN

## 2019-05-19 NOTE — Progress Notes (Signed)
Patient ID: Brittany Friedman, female   DOB: [birth year], 39 y.o.   MRN: [id number] YATZARI JONSSON is a 39 y.o. G1P0 at [redacted]w[redacted]d by LMP admitted for IOL/IUGR  Subjective: Breathing well w/ ctx, starting to feel rectal pressure.  Doula at [facility] and assisting with labor support, spouse also supportive.   Objective: Vitals:   05/19/19 1100 05/19/19 1202 05/19/19 1310 05/19/19 1402  BP: 110/70 121/77 (!) 105/47 108/74  Pulse: 65 67 66 68  Resp: 18 18 18 18   Temp: 98.8 F (37.1 C)   98.7 F (37.1 C)  TempSrc: Oral   Oral  Weight:      Height:          FHT:  FHR: 125 bpm, variability: moderate,  accelerations:  Present,  decelerations:  Present occasional early and small variables UC:   regular, every 2-3 minutes. Palp strong SVE:   Dilation: Lip/rim Effacement (%): 100 Station: 0 Exam by:: [email or web address], CNM  Labs:   Recent Labs    05/19/19 0150  WBC 12.7*  HGB 12.3  HCT 36.5  PLT 174    Assessment / Plan: Induction of labor due to IUGR,  progressing well in physiologic labor  Labor: Good response to misoprostol buccal x 2 doses and cervical balloon, active labor onset at noon, SROM at 1000, clear AF  Expectant mgmt, continue position changes and anticipate active 2nd stage soon Preeclampsia:  no signs or symptoms of toxicity Fetal Wellbeing:  Category I Pain Control:  Labor support without medications I/D:  GBS neg Anticipated MOD:  NSVD  05/21/19, CNM, MSN 05/19/2019, 2:38 PM

## 2019-05-19 NOTE — H&P (Signed)
OB ADMISSION/ HISTORY & PHYSICAL:  Admission Date: 05/19/2019 12:03 AM  Admit Diagnosis: Pregnant [Z34.90]    Brittany Friedman is a 39 y.o. female presenting for scheduled IOL 2/2 IUGR.  Prenatal History: G1P0   EDC : 05/21/2019, by Last Menstrual Period  Prenatal care at [facility] since 14 wks, CNM care.   Prenatal course complicated by: IUGR, VCI followed by MFM  - 30 wks EFW 5%, AC 15%, improved at 36 wks #FW 15%, AC 37%  - ANFT reassuring    Prenatal Labs: ABO, Rh: O (08/11 0000)  Antibody: NEG (02/20 0150) Rubella: Immune (08/11 0000)  RPR: Nonreactive (08/11 0000)  HBsAg: Negative (08/11 0000)  HIV: Non-reactive (08/11 0000)  GBS: Negative/-- (01/29 0000)  1 hr Glucola : wnl Genetic Screening: normal Panorama and AFP1 Ultrasound: normal XX anatomy, persistent IUGR    Maternal Diabetes: No Genetic Screening: Normal Maternal Ultrasounds/Referrals: Other: Fetal Ultrasounds or other Referrals:  Referred to Materal Fetal Medicine  Maternal Substance Abuse:  No Significant Maternal Medications:  None Significant Maternal Lab Results:  Group B Strep negative Other Comments:  None  Medical / Surgical History :  Past medical history:  Past Medical History:  Diagnosis Date  . Medical history non-contributory      Past surgical history:  Past Surgical History:  Procedure Laterality Date  . NO PAST SURGERIES    . WISDOM TOOTH EXTRACTION  2003     Family History:  Family History  Problem Relation Age of Onset  . Hypothyroidism Mother   . Endometrial cancer Mother   . Cancer Father        colon and prostate  . Endometrial cancer Maternal Aunt   . Hemophilia Paternal Uncle   . Hemophilia Paternal Grandfather      Social History:  reports that she has never smoked. She has never used smokeless tobacco. She reports previous alcohol use. She reports that she does not use drugs.   Allergies: Augmentin [amoxicillin-pot clavulanate] and  Zithromax [azithromycin]   Current Medications at time of admission:  Medications Prior to Admission  Medication Sig Dispense Refill Last Dose  . Prenatal Vit w/Fe-Methylfol-FA (PNV PO) Take by mouth.        Review of Systems: ROS  Physical Exam: Vital signs and nursing notes reviewed.  ED Triage Vitals  Enc Vitals Group     BP 05/19/19 0024 112/69     Pulse Rate 05/19/19 0024 76     Resp 05/19/19 0024 15     Temp 05/19/19 0024 98.8 F (37.1 C)     Temp Source 05/19/19 0024 Oral     SpO2 --      Weight 05/19/19 0010 140 lb 11.2 oz (63.8 kg)     Height 05/19/19 0010 5\' 2"  (1.575 m)     Head Circumference --      Peak Flow --      Pain Score 05/19/19 0631 2     Pain Loc --      Pain Edu? --      Excl. in GC? --      General: AAO x 3, NAD, coping well Heart: RRR Lungs:CTAB Abdomen: Gravid, NT, Leopold's vertex, fetal spine to maternal L Extremities: mo edema Genitalia / VE: Dilation: 2/50/-2 Cervical balloon placed, 60 cc fluid inflated and traction to leg.  Patient tolerated well  FHR: 130 BPM, mod variability, + accels, no decels TOCO: Ctx q2 min  Labs:   Pending T&S, CBC, RPR  Recent Labs    05/19/19 0150  WBC 12.7*  HGB 12.3  HCT 36.5  PLT 174       Assessment/Plan:  39 y.o. G1P0 at [redacted]w[redacted]d, IUGR, VCI IOL, cervical ripening in progress, misoprostol x 2 doses overnight, CB placed this AM FHT cat 1 GBS negative  Continue cervical ripening, AROM when favorable Planning natural labor, doula support for active labor Monica telemetry for [employer] / shower for labor comfort   Dr Murrell Redden notified of admission / plan of care   [place], MSN 05/19/2019, 6:46 AM

## 2019-05-20 LAB — CBC
HCT: 35.2 % — ABNORMAL LOW (ref 36.0–46.0)
Hemoglobin: 12 g/dL (ref 12.0–15.0)
MCH: 32.3 pg (ref 26.0–34.0)
MCHC: 34.1 g/dL (ref 30.0–36.0)
MCV: 94.6 fL (ref 80.0–100.0)
Platelets: 155 10*3/uL (ref 150–400)
RBC: 3.72 MIL/uL — ABNORMAL LOW (ref 3.87–5.11)
RDW: 13.2 % (ref 11.5–15.5)
WBC: 19.9 10*3/uL — ABNORMAL HIGH (ref 4.0–10.5)
nRBC: 0 % (ref 0.0–0.2)

## 2019-05-20 LAB — RPR: RPR Ser Ql: NONREACTIVE

## 2019-05-20 NOTE — Lactation Note (Signed)
This note was copied from a baby's chart. Lactation Consultation Note  Patient Name: Girl Tinaya Ceballos [id number] Date: 05/20/2019 Reason for consult: Initial assessment;Term;Primapara;1st time breastfeeding   LC Initial Visit:  Attempted to visit with mother, however, she was eating lunch.  Offered to return later today and mother appreciative.    Consult Status Consult Status: Follow-up Date: 05/20/19 Follow-up type: In-patient    Adena Sima R Avanell Banwart 05/20/2019, 5:07 PM

## 2019-05-20 NOTE — Lactation Note (Signed)
This note was copied from a baby's chart. Lactation Consultation Note  Patient Name: Brittany Friedman FSELT'R Date: 05/20/2019 Reason for consult: Follow-up assessment;Term;Primapara;1st time breastfeeding   LC Initial Visit:  Second attempt made to visit with mother, however, this time she is sleeping.  Will return later today or may have night shift LC visit with her.  Father present.   Consult Status Consult Status: Follow-up Date: 05/20/19 Follow-up type: In-patient    Cashlyn Huguley R Khoa Opdahl 05/20/2019, 5:09 PM

## 2019-05-20 NOTE — Progress Notes (Signed)
Normal lochia, voiding w/o difficulty Pain controlled; breastfeeding  Temp:  [98.3 F (36.8 C)-99.4 F (37.4 C)] 98.4 F (36.9 C) (02/21 0740) Pulse Rate:  [53-69] 69 (02/21 0740) Resp:  [16-20] 16 (02/21 0740) BP: (77-122)/(29-77) 94/62 (02/21 0740) SpO2:  [98 %-100 %] 98 % (02/21 0740)   A&ox3 nml respirations Abd: soft, nt, nd; fundus firm and below umb LE: no edema, nt bilat  CBC Latest Ref Rng & Units 05/20/2019 05/19/2019  WBC 4.0 - 10.5 K/uL 19.9(H) 12.7(H)  Hemoglobin 12.0 - 15.0 g/dL 25.4 98.2  Hematocrit 64.1 - 46.0 % 35.2(L) 36.5  Platelets 150 - 400 K/uL 155 174   A/P: ppd 1 s/p svd 1. Doing well, contin care; d/c home tomorrow

## 2019-05-21 ENCOUNTER — Encounter (HOSPITAL_COMMUNITY): Payer: Self-pay

## 2019-05-21 MED ORDER — BENZOCAINE-MENTHOL 20-0.5 % EX AERO: 1.0000 "application " | INHALATION_SPRAY | CUTANEOUS | 0 refills | Status: AC | PRN

## 2019-05-21 MED ORDER — IBUPROFEN 600 MG PO TABS: 600.0000 mg | ORAL_TABLET | Freq: Four times a day (QID) | ORAL | 0 refills | Status: AC

## 2019-05-21 NOTE — Discharge Summary (Signed)
Obstetric Discharge Summary   Patient Name: Brittany Friedman DOB: 1980/03/30 MRN: [id number]  Date of Admission: 05/19/2019 Date of Discharge: 05/21/2019 Date of Delivery: 05/19/2019 Gestational Age at Delivery: [redacted]w[redacted]d  Primary OB: [facility] - CNM management  Antepartum complications:  - IUGR, VCI followed by MFM             - 30 wks EFW 5%, AC 15%, improved at 36 wks #FW 15%, AC 37%             - ANFT reassuring  Prenatal Labs:  ABO, Rh: O Positive (08/11 0000)  Antibody: NEG (02/20 0150) Rubella: Immune (08/11 0000)  RPR: Nonreactive (08/11 0000)  HBsAg: Negative (08/11 0000)  HIV: Non-reactive (08/11 0000)  GBS: Negative/-- (01/29 0000)  1 hr Glucola : wnl Genetic Screening: normal Panorama and AFP1 Ultrasound: normal XX anatomy, persistent IUGR  Admitting Diagnosis: IOL at 39+5 weeks for IUGR  Secondary Diagnoses: Patient Active Problem List   Diagnosis Date Noted  . Postpartum care following vaginal delivery 2/20 05/19/2019  . SVD (spontaneous vaginal delivery) 05/19/2019  . Perineal laceration, second degree 05/19/2019  . Encounter for planned induction of labor 05/19/2019   Induction: Cytotec x 2 doses, intracervical balloon Augmentation: None Complications: None  Date of Delivery: 05/19/2019 Delivered By: Derrell Lolling, CNM  Delivery Type: spontaneous vaginal delivery Anesthesia: none; local lidocaine for repair Placenta: spontaneous Laceration: 2nd degree perineal  Episiotomy: none  Newborn Data: Live born female  Birth Weight: 6 lb 0.3 oz (2730 g) APGAR: 8, 9  Newborn Delivery   Birth date/time: 05/19/2019 15:59:00 Delivery type: Vaginal, Spontaneous       Hospital/Postpartum Course  (Vaginal Delivery): Pt. Admitted for midnight IOL secondary to IUGR.  She was induced with Cytotec x 2 doses, then intracervical balloon.  After balloon expelled, she ruptured spontaneously and progressed without augmentation.  See notes and delivery summary for  details. Patient had an uncomplicated postpartum course.  By time of discharge on PPD#2, her pain was controlled on oral pain medications; she had appropriate lochia and was ambulating, voiding without difficulty and tolerating regular diet.  She was deemed stable for discharge to home.     Labs: CBC Latest Ref Rng & Units 05/20/2019 05/19/2019  WBC 4.0 - 10.5 K/uL 19.9(H) 12.7(H)  Hemoglobin 12.0 - 15.0 g/dL 12.0 12.3  Hematocrit 36.0 - 46.0 % 35.2(L) 36.5  Platelets 150 - 400 K/uL 035 009   Conflict (See Lab Report): O POS/O POS Performed at [facility], [place] [address], [place], [place] [address]   Physical exam:  BP (!) 94/58 (BP Location: Right Arm)   Pulse (!) 58   Temp 98 F (36.7 C) (Oral)   Resp 16   Ht 5\' 2"  (1.575 m)   Wt 63.8 kg   LMP 08/14/2018   SpO2 95%   BMI 25.73 kg/m   Alert and oriented X3  Lungs: Clear and unlabored  Heart: regular rate and rhythm / no murmurs  Abdomen: soft, non-tender, non-distended              Fundus: firm, non-tender, U+1/U-E  Perineum: well approximated 2nd degree perineal; mild edema, no ecchymosis or evidence of a hematoma  Lochia: small rubra on pad  Extremities: +1 LE edema, no calf pain or tenderness  Disposition: stable, discharge to home Baby Feeding: breast milk and donor breast milk  Baby Disposition: home with mom  Contraception: not discussed  Rh Immune globulin given: N/A Rubella vaccine given:  N/A Tdap vaccine given in AP or PP setting: UTD 2020 Flu vaccine given in AP or PP setting: not on file   Plan:  Brittany Friedman was discharged to home in good condition. Follow-up appointment at [facility] in 2 or 6 weeks.  Discharge Instructions: Per After Visit Summary. Refer to After Visit Summary and [facility] discharge booklet  Activity: Advance as tolerated. Pelvic rest for 6 weeks.   Diet: Regular, Heart Healthy Discharge Medications: Allergies as of 05/21/2019      Reactions   Augmentin  [amoxicillin-pot Clavulanate] Diarrhea   Zithromax [azithromycin] Diarrhea      Medication List    TAKE these medications   benzocaine-Menthol 20-0.5 % Aero Commonly known as: DERMOPLAST Apply 1 application topically as needed for irritation (perineal discomfort).   ibuprofen 600 MG tablet Commonly known as: ADVIL Take 1 tablet (600 mg total) by mouth every 6 (six) hours.   PNV PO Take by mouth.            Discharge Care Instructions  (From admission, onward)         Start     Ordered   05/21/19 0000  Discharge wound care:    Comments: Warm water sitz baths 2-3 times per day as needed   05/21/19 1002         Outpatient follow up:  Follow-up Information    Neta Mends, CNM. Schedule an appointment as soon as possible for a visit in 6 week(s).   Specialty: Obstetrics and Gynecology Why: Postpartum visit; okay for 2 week visit if needed  Contact information: [address] [place] [address] [phone]           Signed:  Carlean Jews, MSN, CNM [facility]

## 2019-05-21 NOTE — Lactation Note (Signed)
This note was copied from a baby's chart. Lactation Consultation Note  Patient Name: Brittany Friedman MNOTR'R Date: 05/21/2019 Reason for consult: Follow-up assessment   P1, Baby 40 hours old and latched upon entering.  7.4% weight loss.  Baby < 6 lbs.  Encouraged mother to compress breast during session and limit sessions to 30 min. Mother is pumping and supplementing with Donor Milk.   Mother has personal DEBP at home.   Discussed milk storage.  Reviewed engorgement care and monitoring voids/stools.  Plan: 1. Keep baby STS as much as possible  2. Offer breast when baby cues that he/she is hungry, or awaken baby for feeding at 3 - 4  hrs. 3.  Breast feed baby, asking for help prn.  Limit to 30 mins so not to overtire baby. 4. If baby does not latch after 10 min of attempt - give supplemental breastmilk/donor milk.  Slow flow nipple bottle is an option.  5.  Pump both breasts 15-20 minutes on initiation setting, adding breast massage and hand expression to collect as much colostrum as possible to feed baby.  Currently mother is pumping after each session. Recommend after Ped MD appt tomorrow and if weight has stabilized, mother can start pumping 4-6 times per day or every other feeding. 6.  Feed baby  EBM/donor milk after breastfeeding per LPTI volume guidelines increasing per day of life and as baby desires.      Maternal Data    Feeding Feeding Type: Breast Fed  LATCH Score Latch: Grasps breast easily, tongue down, lips flanged, rhythmical sucking.(latched upon entering)  Audible Swallowing: A few with stimulation  Type of Nipple: Everted at rest and after stimulation  Comfort (Breast/Nipple): Soft / non-tender  Hold (Positioning): Assistance needed to correctly position infant at breast and maintain latch.  LATCH Score: 8  Interventions Interventions: Breast feeding basics reviewed;DEBP  Lactation Tools Discussed/Used     Consult Status Consult Status:  Complete Date: 05/21/19    Brittany Friedman [facility] 05/21/2019, 8:38 AM

## 2019-05-21 NOTE — Lactation Note (Signed)
This note was copied from a baby's chart. Lactation Consultation Note Baby 35 hrs old at time of consult. Baby is cluster feeding. Discussed w/mom the need for supplementation d/t less than 6 lbs. Mom in agreement. Gave mom LPI information sheet and reviewed.  Hand expression demonstrated w/no colostrum noted. Mom has generalized edema. Breast feel full. Small breast semi compressible. Mom has small areola and normal size everted nipples. Discussed the need for pumping to stimulate milk production. Mom shown how to use DEBP & how to disassemble, clean, & reassemble parts. Mom knows to pump q3h for 15-20 min. Mom pumping when LC left room. Mom encouraged to feed baby 8-12 times/24 hours and with feeding cues.   Discussed supplementation options, mom chose Donor milk. Mom plans to breast feed, pump and bottle feed. Discussed options for supplementing, mom chose bottle. Yellow slow flow nipple used, baby tolerated well. Mom signed consent for Donor milk. Gave baby 15 ml. Milk storage reviewed for colostrum and use of Donor milk. Reported to RN to ask for order for Donor milk Specific to baby.  Newborn behavior, STS, I&O, breast massage, positioning and support, supply and demand discussed. Mom encouraged to waken baby for feeding if baby hasn't cued in 3 hrs. Lactation brochure at bedside. Encouraged mom to call for assistance or questions.  Patient Name: Brittany Friedman [id number] Date: 05/21/2019 Reason for consult: Initial assessment;Primapara;Infant < 6lbs;Term   Maternal Data Has patient been taught Hand Expression?: Yes Does the patient have breastfeeding experience prior to this delivery?: No  Feeding Feeding Type: Donor Breast Milk  LATCH Score       Type of Nipple: Everted at rest and after stimulation  Comfort (Breast/Nipple): Soft / non-tender        Interventions Interventions: Breast feeding basics reviewed;Support pillows;Position options;Skin to  skin;Breast massage;Hand express;Breast compression;DEBP  Lactation Tools Discussed/Used Tools: Pump Breast pump type: Double-Electric Breast Pump WIC Program: No Pump Review: Setup, frequency, and cleaning;Milk Storage Initiated by:: Peri Jefferson RN IBCLC Date initiated:: 05/21/19   Consult Status Consult Status: Follow-up Date: 05/22/19 Follow-up type: In-patient    Charyl Dancer 05/21/2019, 4:37 AM

## 2019-05-21 NOTE — Discharge Instructions (Signed)
Sitz baths 2 times /day with warm water x 1 week May add herbals: 1 ounce dried comfrey leaf* 1 ounce calendula flowers 1 ounce lavender flowers 1/2 ounce dried uva ursi leaves 1/2 ounce witch hazel blossoms (if you can find them) 1/2 ounce dried sage leaf 1/2 cup sea salt Directions: Bring 2 quarts of water to a boil. Turn off heat, and place 1 ounce (approximately 1 large handful) of the above mixed herbs (not the salt) into the pot. Steep, covered, for 30 minutes.  Strain the liquid well with a fine mesh strainer, and discard the herb material. Add 2 quarts of liquid to the tub, along with the 1/2 cup of salt. This medicinal liquid can also be made into compresses and peri-rinses. 

## 2019-05-21 NOTE — Progress Notes (Addendum)
PPD #2, SVD, 2nd degree perineal repair, baby girl "Lanora Manis"  S:  Reports feeling okay, overwhelmed; having some difficulty with lactation and latching, using donor breast milk. Also c/o pain from perineum and pressure.             Tolerating po/ No nausea or vomiting / Denies dizziness or SOB             Bleeding is getting lighter             Pain controlled with Motrin             Up ad lib / ambulatory / voiding QS without difficulty   O:               VS: BP (!) 94/58 (BP Location: Right Arm)   Pulse (!) 58   Temp 98 F (36.7 C) (Oral)   Resp 16   Ht 5\' 2"  (1.575 m)   Wt 63.8 kg   LMP 08/14/2018   SpO2 95%   BMI 25.73 kg/m    LABS:              Recent Labs    05/19/19 0150 05/20/19 0514  WBC 12.7* 19.9*  HGB 12.3 12.0  PLT 174 155               Blood type: --/--/O POS, O POS Performed at [facility], [address], [place], [place]  [phone])  Rubella: Immune (08/11 0000)                     I&O: Intake/Output      02/21 0701 - 02/22 0700 02/22 0701 - 02/23 0700   Blood     Total Output     Net                        Physical Exam:             Alert and oriented X3  Lungs: Clear and unlabored  Heart: regular rate and rhythm / no murmurs  Abdomen: soft, non-tender, non-distended              Fundus: firm, non-tender, U+1/U-E  Perineum: well approximated 2nd degree perineal; mild edema, no ecchymosis or evidence of a hematoma  Lochia: small rubra on pad  Extremities: +1 LE edema, no calf pain or tenderness    A: PPD # 2, SVD  2nd degree perineal    Doing well - stable status  P: Routine post partum orders  Encouragement provided and advised to utilize outpatient lactation services   Discharge home today  WOB discharge book given, warning s/s reviewed  F/u for PP visit in 6 weeks, but offered 2 weeks interval PP visit for additional support  3/23, MSN, CNM [facility]

## 2019-05-22 LAB — SURGICAL PATHOLOGY

## 2019-09-24 DIAGNOSIS — Z01419 Encounter for gynecological examination (general) (routine) without abnormal findings: Secondary | ICD-10-CM | POA: Diagnosis not present

## 2019-09-24 DIAGNOSIS — Z6822 Body mass index (BMI) 22.0-22.9, adult: Secondary | ICD-10-CM | POA: Diagnosis not present

## 2021-03-22 IMAGING — US US MFM UA CORD DOPPLER
1 series · 13 of 28 positions shown · non-contrast
Comparison: none

[Series 1: us mfm ua cord doppler · 13 of 43 slices shown]
[im 2/43]
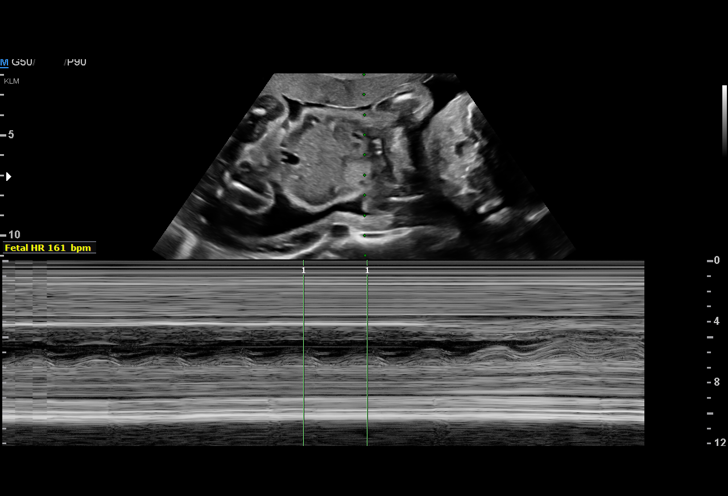
[im 5/43]
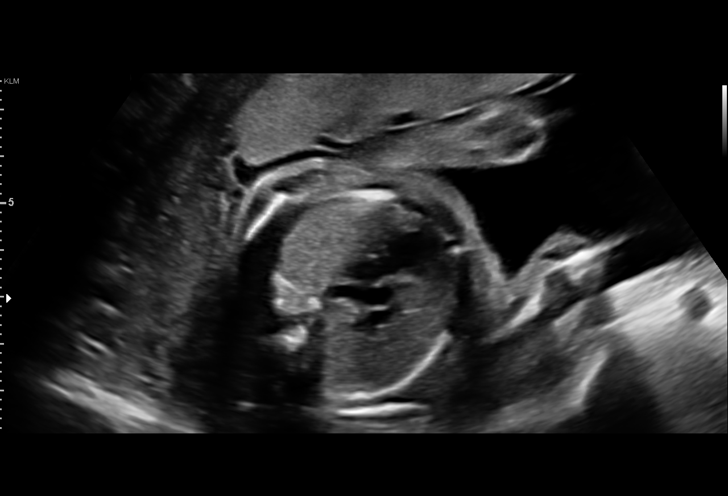
[im 8/43]
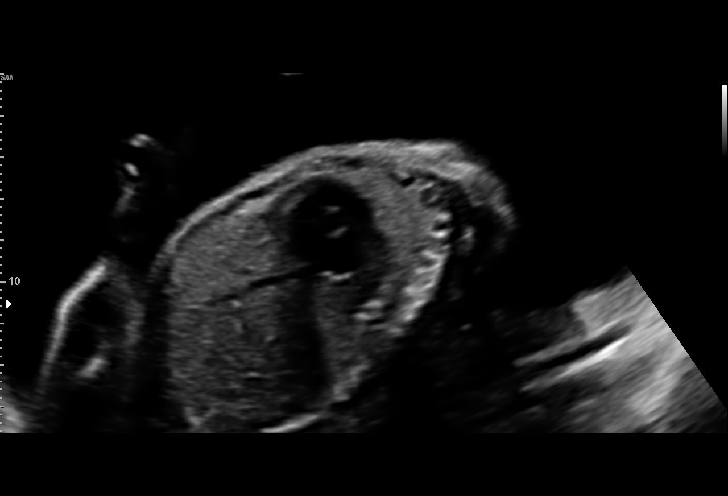
[im 11/43]
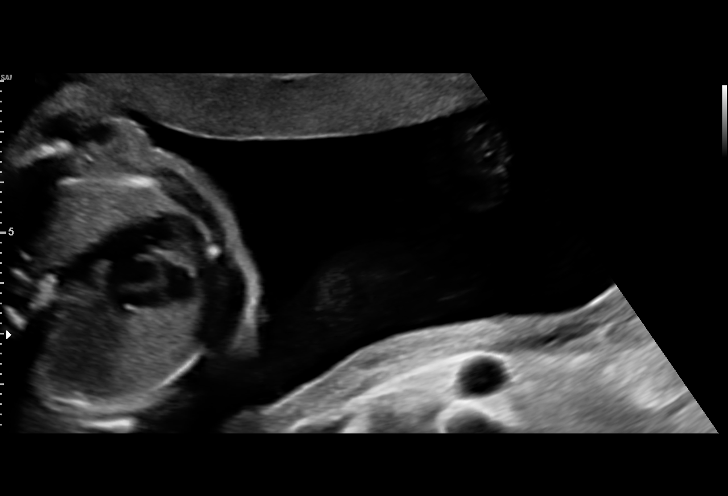
[im 15/43]
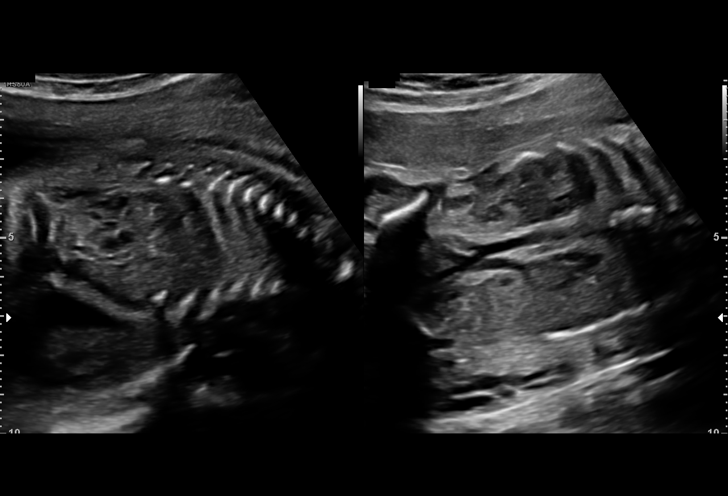
[im 18/43]
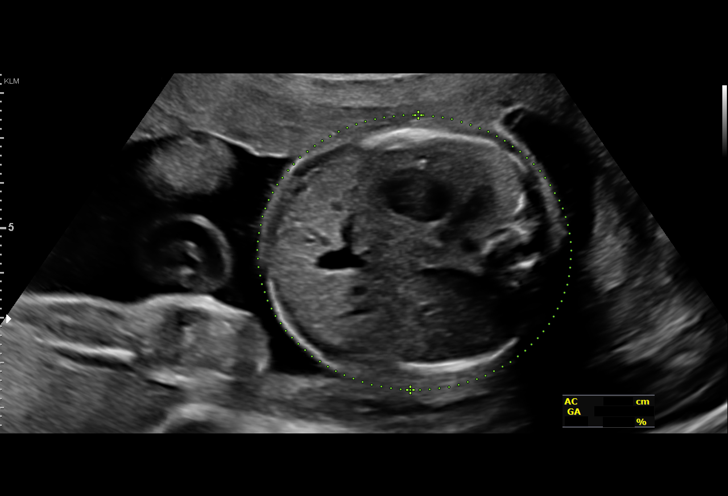
[im 22/43]
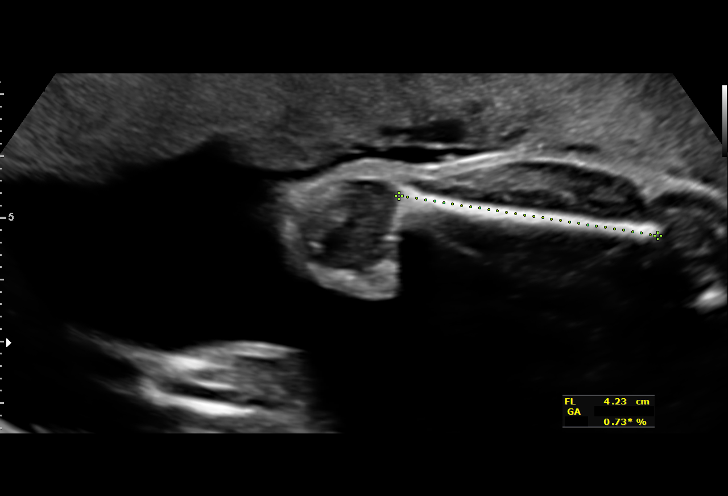
[im 25/43]
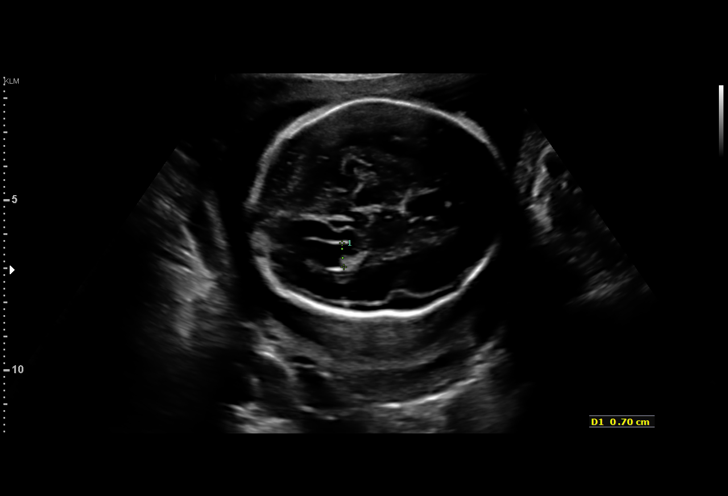
[im 29/43]
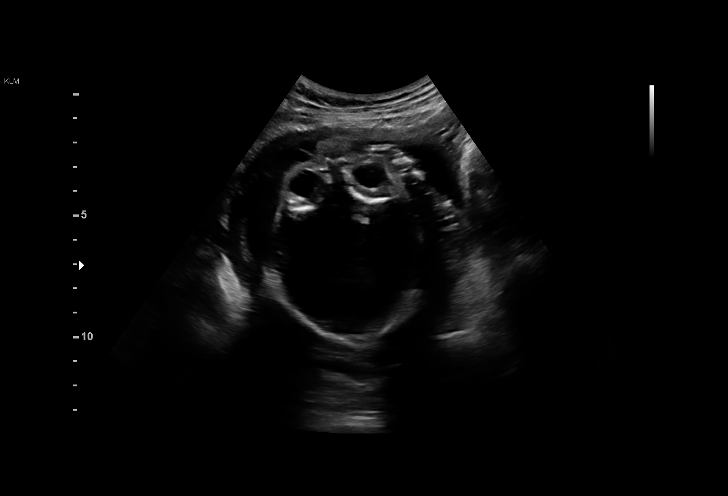
[im 32/43]
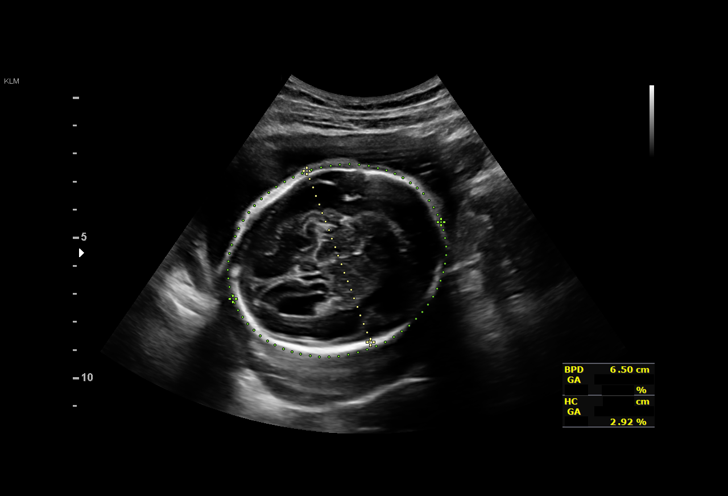
[im 35/43]
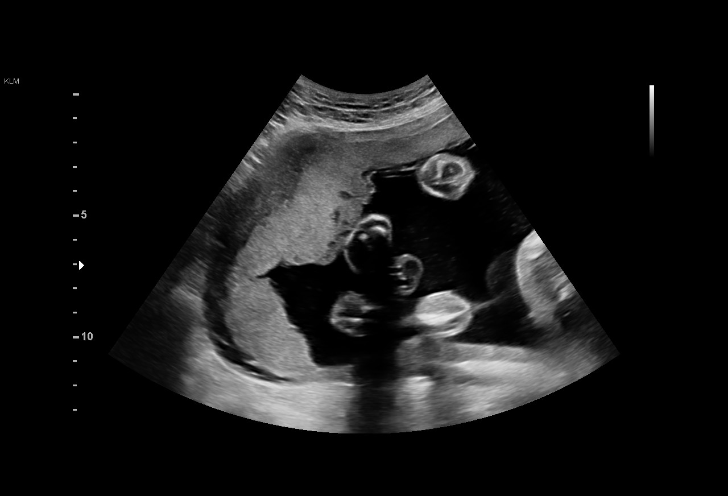
[im 38/43]
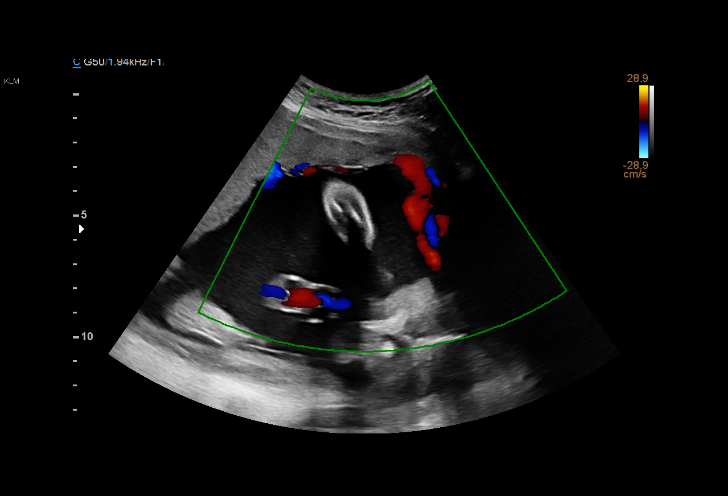
[im 41/43]
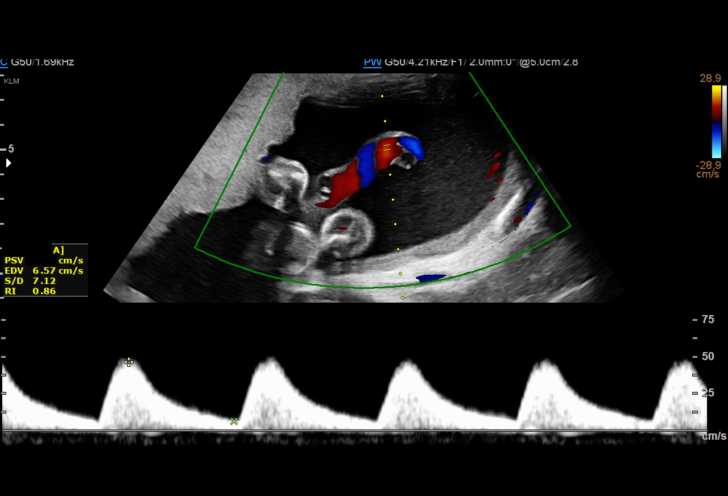

[13 of 28 positions shown; findings below may reference images not displayed]

[id number] [[facility]] [facility]
                   [place] CNM

 ----------------------------------------------------------------------

 ----------------------------------------------------------------------
Indications

  Advanced maternal age multigravida 35+,
  second trimester
  26 weeks gestation of pregnancy
  Velamentous insertion of umbilical cord
  Low Risk NIPS (Negative AFP)
  Maternal care for known or suspected poor
  fetal growth, second trimester, not applicable
  or unspecified IUGR
 ----------------------------------------------------------------------
Fetal Evaluation

 Num Of Fetuses:         1
 Fetal Heart Rate(bpm):  161
 Cardiac Activity:       Observed
 Presentation:           Cephalic
 Placenta:               Anterior
 P. Cord Insertion:      Velamentous insertion

 Amniotic Fluid
 AFI FV:      Within normal limits
Biometry

 BPD:      64.2  mm     G. Age:  26w 0d         29  %    CI:        77.17   %    70 - 86
                                                         FL/HC:      18.8   %    18.6 -
 HC:      231.4  mm     G. Age:  25w 1d          4  %    HC/AC:      1.12        1.04 -
 AC:      207.3  mm     G. Age:  25w 2d         15  %    FL/BPD:     67.8   %    71 - 87
 FL:       43.5  mm     G. Age:  24w 2d          2  %    FL/AC:      21.0   %    20 - 24

 Est. FW:     753  gm    1 lb 11 oz       5  %
OB History

 Gravidity:    1
Gestational Age

 LMP:           26w 2d        Date:  08/14/18                 EDD:   05/21/19
 U/S Today:     25w 1d                                        EDD:   05/29/19
 Best:          26w 2d     Det. By:  LMP  (08/14/18)          EDD:   05/21/19
Anatomy

 Cranium:               Appears normal         LVOT:                   Appears normal
 Cavum:                 Appears normal         Aortic Arch:            Appears normal
 Ventricles:            Appears normal         Ductal Arch:            Appears normal
 Choroid Plexus:        Appears normal         Diaphragm:              Appears normal
 Cerebellum:            Appears normal         Stomach:                Appears normal, left
                                                                       sided
 Posterior Fossa:       Appears normal         Abdomen:                Appears normal
 Nuchal Fold:           Not applicable (>20    Abdominal Wall:         Previously seen
                        wks GA)
 Face:                  Appears normal         Cord Vessels:           Previously seen
                        (orbits and profile)
 Lips:                  Appears normal         Kidneys:                Appear normal
 Palate:                Previously seen        Bladder:                Appears normal
 Thoracic:              Appears normal         Spine:                  Previously seen
 Heart:                 Appears normal         Upper Extremities:      Previously seen
                        (4CH, axis, and
                        situs)
 RVOT:                  Appears normal         Lower Extremities:      Previously seen

 Other:  Female gender Heels previously visualized. 5th digit previously
         visualized.
Doppler - Fetal Vessels

 Umbilical Artery
  S/D     %tile                                            ADFV    RDFV
 5.79    > 97.5                                               No      No

Cervix Uterus Adnexa

 Cervix
 Not visualized (advanced GA >96wks)
Impression

 Known IUGR with AC >10th%
 New elevated UA Dopplers
 New velamentous cord insertion, which still persist
 Good fetal movement and normal fluid
Recommendations

 Continue weekly testing with UA Dopplers
 Given elevated UA Dopplers initiate weekly NST's this was
 scheduled today
 Follow up growth in 3-4 weeks.

## 2021-06-06 IMAGING — US US MFM FETAL BPP W/O NON-STRESS
1 series · 15 of 28 positions shown · non-contrast
Comparison: none

[Series 1: us mfm fetal bpp w/o non-stress · 36 acquisitions, 15 frames shown]
[im 1/36]
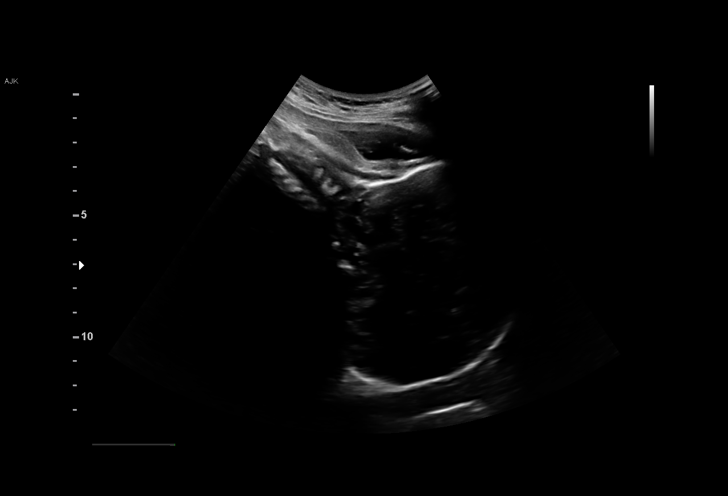
[im 3/36]
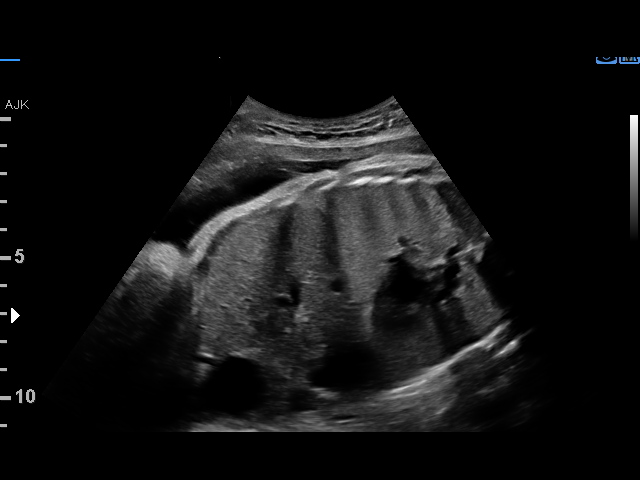
[im 6/36]
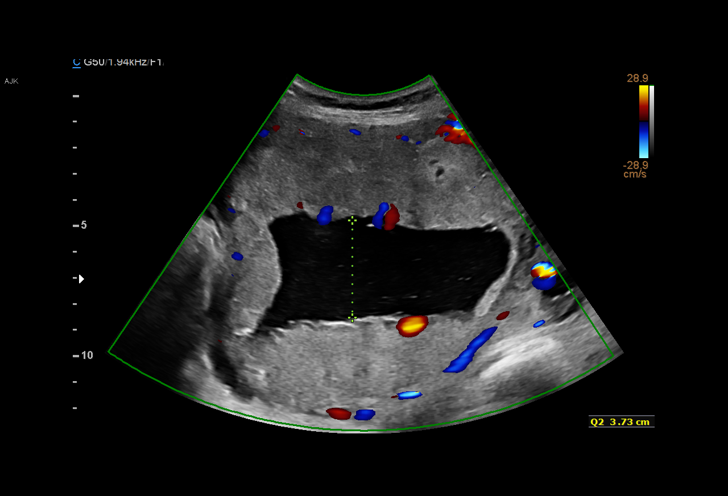
[im 8/36]
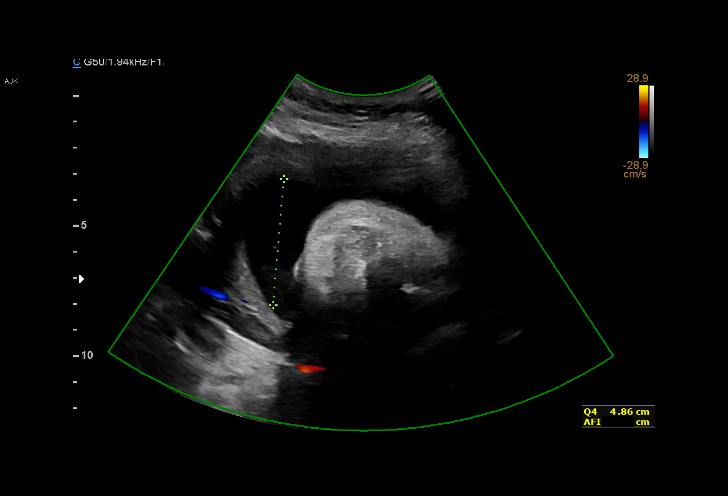
[im 11/36]
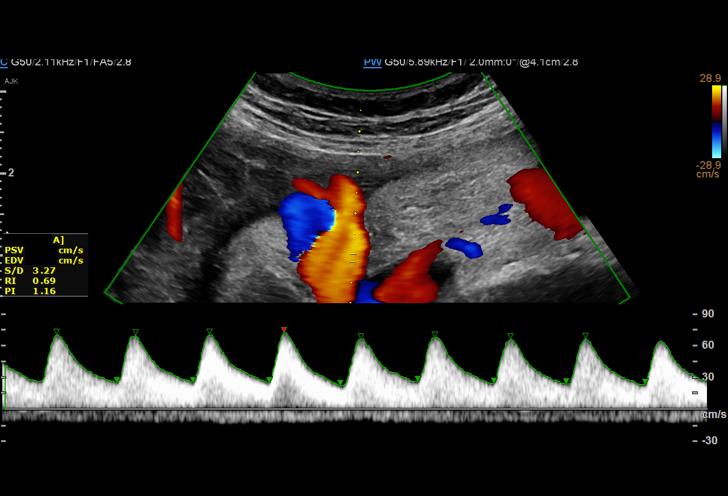
[im 13/36]
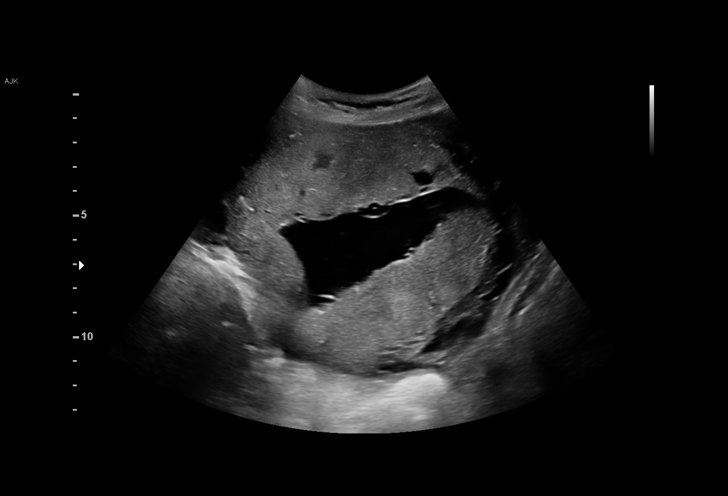
[im 16/36]
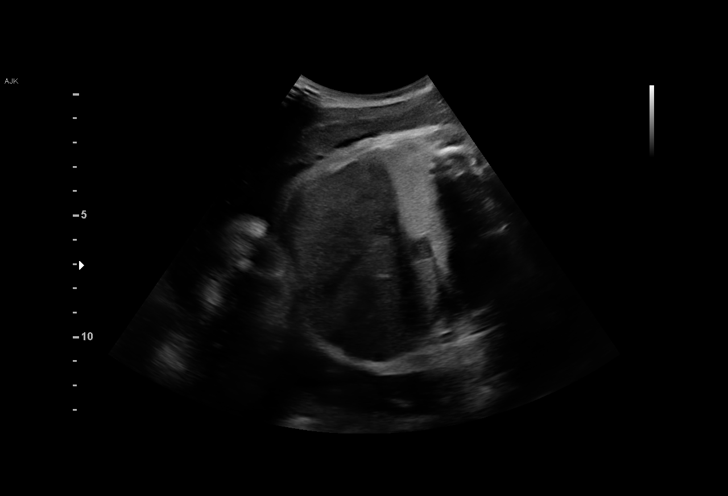
[im 19/36]
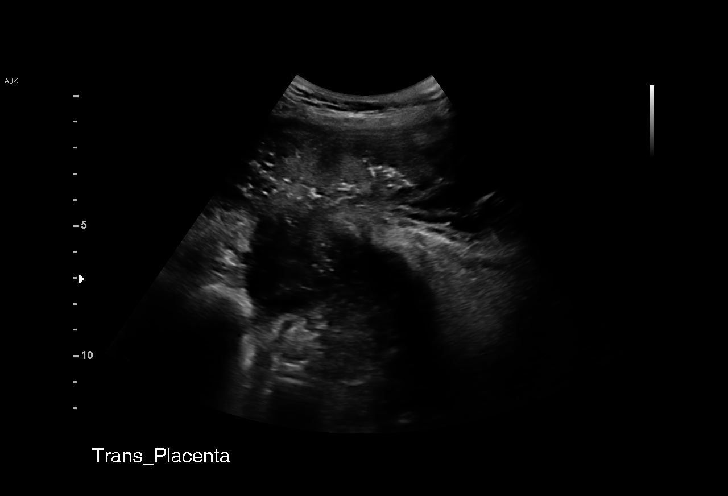
[im 20/36]
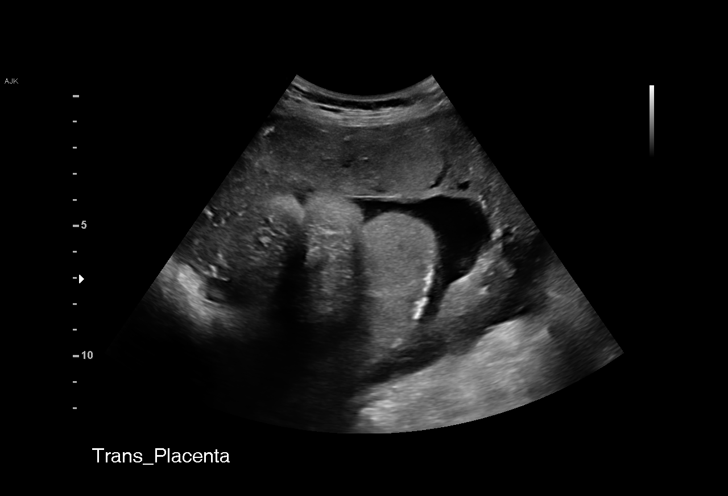
[im 23/36]
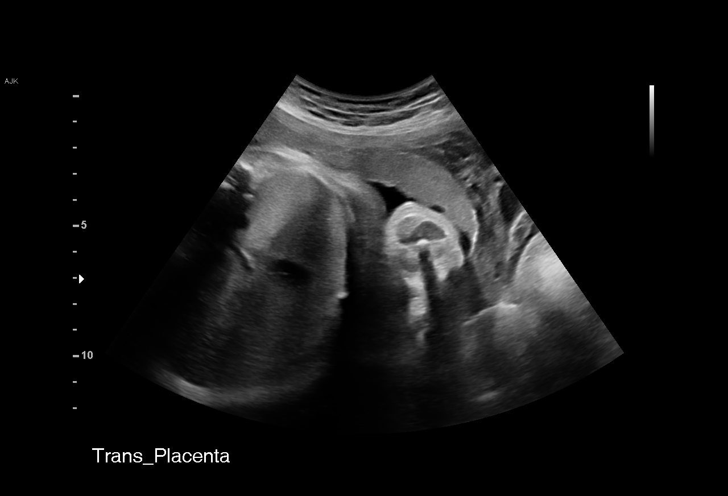
[im 25/36]
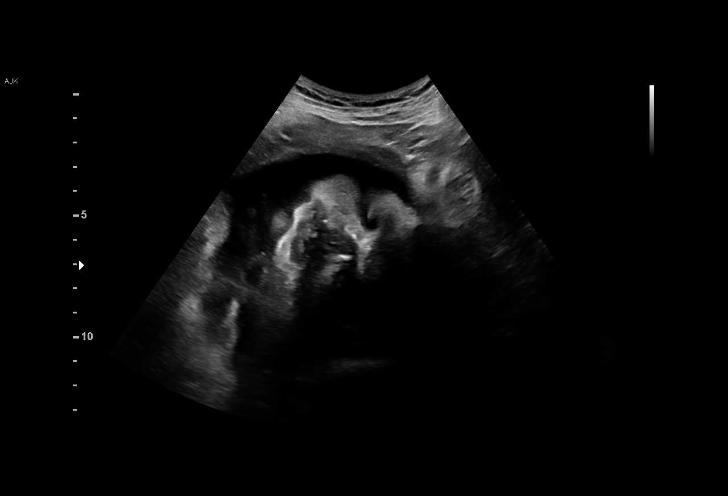
[im 28/36]
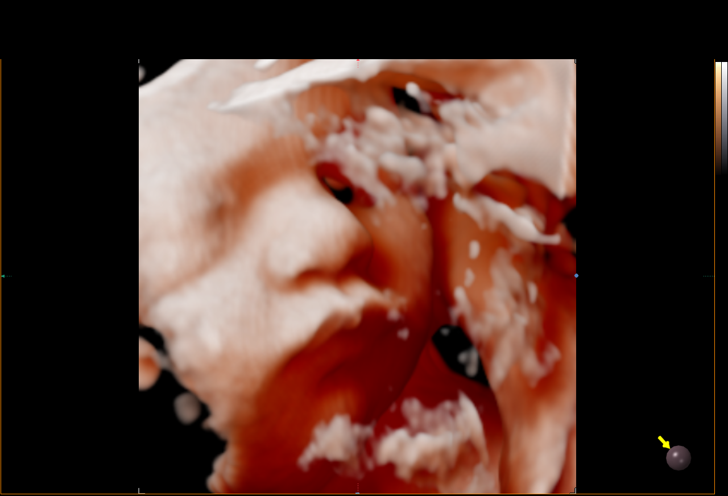
[im 30/36]
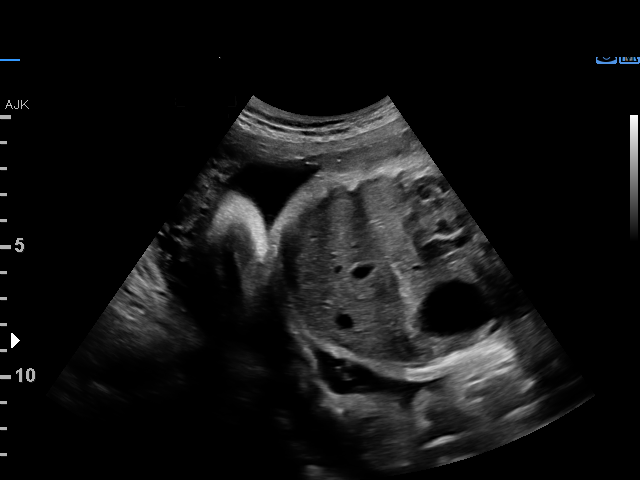
[im 33/36]
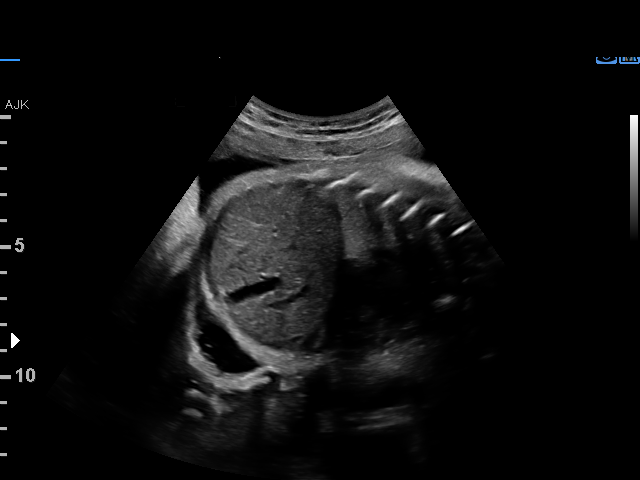
[im 36/36]
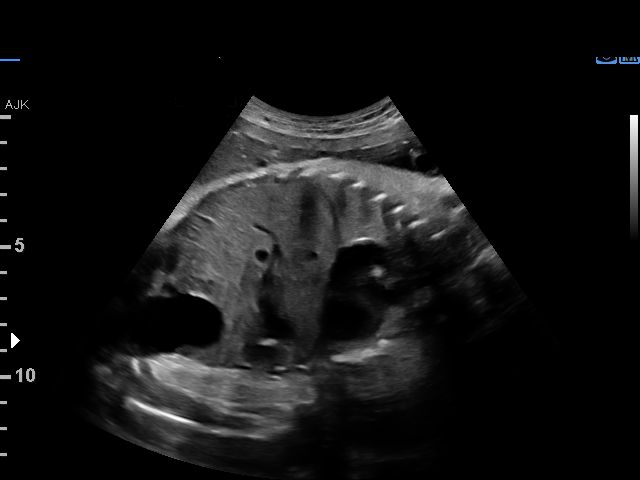

[15 of 28 positions shown; findings below may reference images not displayed]

[id number] [[facility]] [facility]
                   [place] CNM

     STRESS                                            QUIRIJN
  2  US MFM UA CORD DOPPLER               [id number]     [place]
                                                       QUIRIJN
 ----------------------------------------------------------------------

 ----------------------------------------------------------------------
Indications

  37 weeks gestation of pregnancy
 ----------------------------------------------------------------------
Fetal Evaluation

 Num Of Fetuses:         1
 Fetal Heart Rate(bpm):  122
 Cardiac Activity:       Observed
 Presentation:           Cephalic
 Placenta:               Left lateral
 P. Cord Insertion:      Velamentous insertion

 Amniotic Fluid
 AFI FV:      Within normal limits

 AFI Sum(cm)     %Tile       Largest Pocket(cm)
 14.84           56

 RUQ(cm)       RLQ(cm)       LUQ(cm)        LLQ(cm)


 Comment:    Stomach, bladder, and diaphragm seen.
Biophysical Evaluation

 Amniotic F.V:   Within normal limits       F. Tone:        Observed
 F. Movement:    Observed                   Score:          [DATE]
 F. Breathing:   Observed
OB History

 Gravidity:    1
Gestational Age

 LMP:           37w 1d        Date:  08/14/18                 EDD:   05/21/19
 Best:          37w 1d     Det. By:  LMP  (08/14/18)          EDD:   05/21/19
Doppler - Fetal Vessels

 Umbilical Artery
  S/D     %tile     RI              PI                     ADFV    RDFV
 2.95       83   0.66             1.03                        No      No

Impression

 Known IUGR with normal AC
 Biophsyical profile [DATE]
 Normal UA Dopplers
 Good fetal movement and amniotic fluid.
Recommendations

 Continue weekly testing
 Recommend delivery at 39 weeks.
